# Patient Record
Sex: Male | Born: 1970
Health system: Southern US, Community
[De-identification: ages and names within clinical notes are randomized; demographics above are authoritative.]

## PROBLEM LIST (undated history)

## (undated) DIAGNOSIS — G8929 Other chronic pain: Secondary | ICD-10-CM

## (undated) DIAGNOSIS — Z8739 Personal history of other diseases of the musculoskeletal system and connective tissue: Secondary | ICD-10-CM

## (undated) DIAGNOSIS — E119 Type 2 diabetes mellitus without complications: Secondary | ICD-10-CM

## (undated) DIAGNOSIS — M549 Dorsalgia, unspecified: Secondary | ICD-10-CM

## (undated) DIAGNOSIS — I10 Essential (primary) hypertension: Secondary | ICD-10-CM

## (undated) DIAGNOSIS — M4800 Spinal stenosis, site unspecified: Secondary | ICD-10-CM

## (undated) DIAGNOSIS — M5126 Other intervertebral disc displacement, lumbar region: Secondary | ICD-10-CM

## (undated) HISTORY — PX: ANKLE SURGERY: SHX546

---

## 2010-03-02 ENCOUNTER — Emergency Department (HOSPITAL_COMMUNITY): Admission: EM | Admit: 2010-03-02 | Discharge: 2010-03-02 | Payer: Self-pay | Admitting: Emergency Medicine

## 2013-05-29 ENCOUNTER — Emergency Department (HOSPITAL_COMMUNITY)
Admission: EM | Admit: 2013-05-29 | Discharge: 2013-05-30 | Disposition: A | Discharge status: Home / Self Care | Payer: PRIVATE HEALTH INSURANCE | Attending: Emergency Medicine | Admitting: Emergency Medicine

## 2013-05-29 ENCOUNTER — Encounter (HOSPITAL_COMMUNITY): Payer: Self-pay | Admitting: Adult Health

## 2013-05-29 DIAGNOSIS — L03039 Cellulitis of unspecified toe: Secondary | ICD-10-CM | POA: Insufficient documentation

## 2013-05-29 DIAGNOSIS — L03032 Cellulitis of left toe: Secondary | ICD-10-CM

## 2013-05-29 NOTE — ED Notes (Signed)
Presents with ingrown toenail to left great toe that began 3-4 days ago and has been draining purulent drainage.

## 2013-05-30 NOTE — ED Provider Notes (Signed)
CSN: [id number]     Arrival date & time 05/29/13  2059 History   First MD Initiated Contact with Patient 05/29/13 2347     Chief Complaint  Patient presents with  . Toe Pain   (Consider location/radiation/quality/duration/timing/severity/associated sxs/prior Treatment) HPI Comments: Is a normal, healthy, active 43 year old gentleman, who presents with left toe pain.  It appears that he has a paronychia.  He has tried using Green alcohol and soaking without resolution  Patient is a 42 y.o. male presenting with toe pain. The history is provided by the patient.  Toe Pain This is a new problem. The current episode started in the past 7 days. The problem occurs constantly. The problem has been gradually worsening. Pertinent negatives include no joint swelling.    History reviewed. No pertinent past medical history. History reviewed. No pertinent past surgical history. History reviewed. No pertinent family history. History  Substance Use Topics  . Smoking status: Never Smoker   . Smokeless tobacco: Not on file  . Alcohol Use: Yes    Review of Systems  Musculoskeletal: Negative for joint swelling.  Skin: Positive for wound.  All other systems reviewed and are negative.    Allergies  Review of patient's allergies indicates no known allergies.  Home Medications  No current outpatient prescriptions on file. BP 150/85  Pulse 86  Temp(Src) 98.5 F (36.9 C) (Oral)  Resp 20  SpO2 97% Physical Exam  Nursing note and vitals reviewed. Constitutional: He appears well-developed and well-nourished.  Eyes: Pupils are equal, round, and reactive to light.  Neck: Normal range of motion.  Cardiovascular: Normal rate and regular rhythm.   Musculoskeletal: He exhibits tenderness.       Feet:  Or Nicki a    ED Course  INCISION AND DRAINAGE Date/Time: 05/30/2013 12:18 AM Performed by: Arman Filter Authorized by: Arman Filter Consent: Verbal consent obtained. Risks and benefits:  risks, benefits and alternatives were discussed Consent given by: patient Patient identity confirmed: verbally with patient Indications for incision and drainage: Paronychia left great, toe. Local anesthetic: lidocaine 1% with epinephrine Anesthetic total: 0.5 ml Patient sedated: no Needle gauge: 18 Incision type: single straight Complexity: simple Drainage: purulent Drainage amount: moderate Wound treatment: wound left open   (including critical care time) Labs Review Labs Reviewed - No data to display Imaging Review No results found.  MDM   1. Paronychia of great toe of left foot     Paronychia drained    Arman Filter, NP 05/30/13 0020

## 2013-05-30 NOTE — ED Provider Notes (Signed)
Medical screening examination/treatment/procedure(s) were performed by non-physician practitioner and as supervising physician I was immediately available for consultation/collaboration.  Genita Nilsson, MD 05/30/13 0028 

## 2013-05-30 NOTE — ED Notes (Signed)
Pt soaking left foot in warm water with betadine.

## 2014-07-11 ENCOUNTER — Emergency Department (HOSPITAL_BASED_OUTPATIENT_CLINIC_OR_DEPARTMENT_OTHER)
Admission: EM | Admit: 2014-07-11 | Discharge: 2014-07-12 | Disposition: A | Discharge status: Home / Self Care | Payer: Worker's Compensation | Attending: Emergency Medicine | Admitting: Emergency Medicine

## 2014-07-11 ENCOUNTER — Encounter (HOSPITAL_BASED_OUTPATIENT_CLINIC_OR_DEPARTMENT_OTHER): Payer: Self-pay | Admitting: Emergency Medicine

## 2014-07-11 DIAGNOSIS — Y990 Civilian activity done for income or pay: Secondary | ICD-10-CM | POA: Diagnosis not present

## 2014-07-11 DIAGNOSIS — Y9289 Other specified places as the place of occurrence of the external cause: Secondary | ICD-10-CM | POA: Diagnosis not present

## 2014-07-11 DIAGNOSIS — S3992XA Unspecified injury of lower back, initial encounter: Secondary | ICD-10-CM | POA: Diagnosis present

## 2014-07-11 DIAGNOSIS — M545 Low back pain, unspecified: Secondary | ICD-10-CM

## 2014-07-11 DIAGNOSIS — S6990XA Unspecified injury of unspecified wrist, hand and finger(s), initial encounter: Secondary | ICD-10-CM | POA: Insufficient documentation

## 2014-07-11 DIAGNOSIS — W010XXA Fall on same level from slipping, tripping and stumbling without subsequent striking against object, initial encounter: Secondary | ICD-10-CM | POA: Insufficient documentation

## 2014-07-11 DIAGNOSIS — M25539 Pain in unspecified wrist: Secondary | ICD-10-CM

## 2014-07-11 DIAGNOSIS — Y9389 Activity, other specified: Secondary | ICD-10-CM | POA: Insufficient documentation

## 2014-07-11 NOTE — ED Notes (Signed)
Patient reports that he slipped on oil at work and fell backwards. Complains of lower back pain and bilateral wrist pain, no loc

## 2014-07-12 ENCOUNTER — Emergency Department (HOSPITAL_BASED_OUTPATIENT_CLINIC_OR_DEPARTMENT_OTHER): Payer: Worker's Compensation

## 2014-07-12 MED ORDER — NAPROXEN 500 MG PO TABS: 500.0000 mg | ORAL_TABLET | Freq: Two times a day (BID) | ORAL | Status: DC

## 2014-07-12 MED ORDER — NAPROXEN 250 MG PO TABS
500.0000 mg | ORAL_TABLET | Freq: Once | ORAL | Status: AC
  Administered 2014-07-12: 500 mg via ORAL
  Filled 2014-07-12: qty 2

## 2014-07-12 NOTE — ED Provider Notes (Signed)
CSN: [id number]     Arrival date & time 07/11/14  2314 History   First MD Initiated Contact with Patient 07/11/14 2341     Chief Complaint  Patient presents with  . Fall     (Consider location/radiation/quality/duration/timing/severity/associated sxs/prior Treatment) HPI 43 yo male presents to the ER from work after a fall.  Pt reports he slipped on oil, landing on his hands and right back/hip area.  Pt c/o bilateral wrist soreness and right back pain.  No weakness, numbness, bowel or bladder dysfunction.  Pt denies any hand weakness. History reviewed. No pertinent past medical history. History reviewed. No pertinent past surgical history. No family history on file. History  Substance Use Topics  . Smoking status: Never Smoker   . Smokeless tobacco: Not on file  . Alcohol Use: Yes    Review of Systems  All other systems reviewed and are negative.     Allergies  Review of patient's allergies indicates no known allergies.  Home Medications   Prior to Admission medications   Not on File   BP 138/79  Pulse 95  Temp(Src) 97.6 F (36.4 C) (Oral)  Resp 18  Wt 215 lb (97.523 kg)  SpO2 97% Physical Exam  Nursing note and vitals reviewed. Constitutional: He is oriented to person, place, and time. He appears well-developed and well-nourished. He appears distressed (uncomfortable appearing).  HENT:  Head: Normocephalic and atraumatic.  Nose: Nose normal.  Mouth/Throat: Oropharynx is clear and moist.  Eyes: Conjunctivae and EOM are normal. Pupils are equal, round, and reactive to light.  Neck: Normal range of motion. Neck supple. No JVD present. No tracheal deviation present. No thyromegaly present.  Cardiovascular: Normal rate, regular rhythm, normal heart sounds and intact distal pulses.  Exam reveals no gallop and no friction rub.   No murmur heard. Pulmonary/Chest: Effort normal and breath sounds normal. No stridor. No respiratory distress. He has no wheezes. He has no  rales. He exhibits no tenderness.  Abdominal: Soft. Bowel sounds are normal. He exhibits no distension and no mass. There is no tenderness. There is no rebound and no guarding.  Musculoskeletal: Normal range of motion. He exhibits tenderness (ttp over lower lumbar without stepoff or crepitus, ttp over right SI joint, right paraspinal muscles). He exhibits no edema.  No swelling of wrists.  No pain with ROM, no snuffbox tenderness with palpation.    Lymphadenopathy:    He has no cervical adenopathy.  Neurological: He is alert and oriented to person, place, and time. He displays normal reflexes. He exhibits normal muscle tone. Coordination normal.  Skin: Skin is warm and dry. No rash noted. No erythema. No pallor.  Psychiatric: He has a normal mood and affect. His behavior is normal. Judgment and thought content normal.    ED Course  Procedures (including critical care time) Labs Review Labs Reviewed - No data to display  Imaging Review Dg Lumbar Spine Complete  07/12/2014   CLINICAL DATA:  Slipped on oil while at work and fell backwards, with acute onset of lower back pain. Initial encounter.  EXAM: LUMBAR SPINE - COMPLETE 4+ VIEW  COMPARISON:  None.  FINDINGS: There is no evidence of fracture or subluxation. Vertebral bodies demonstrate normal height and alignment. Intervertebral disc spaces are preserved. The visualized neural foramina are grossly unremarkable in appearance.  The visualized bowel gas pattern is unremarkable in appearance; air and stool are noted within the colon. The sacroiliac joints are within normal limits.  IMPRESSION: No evidence of fracture  or subluxation along the lumbar spine.   Electronically Signed   By: Roanna RaiderJeffery  Chang M.D.   On: 07/12/2014 01:20     EKG Interpretation None      MDM   Final diagnoses:  Fall due to wet surface, initial encounter  Right-sided low back pain without sciatica  Wrist pain, acute, unspecified laterality    43 yo male s/p fall  at work.  Will get lumbar films, treat with nsaid.    Olivia Mackielga M Annalucia Laino, MD 07/12/14 [phone]

## 2014-07-12 NOTE — Discharge Instructions (Signed)
Take medications as prescribed.  Expect to have some soreness over the next 7-10 days.  Follow up with your doctor for recheck in 5-7 days.   Back Exercises Back exercises help treat and prevent back injuries. The goal is to increase your strength in your belly (abdominal) and back muscles. These exercises can also help with flexibility. Start these exercises when told by your doctor. HOME CARE Back exercises include: Pelvic Tilt.  Lie on your back with your knees bent. Tilt your pelvis until the lower part of your back is against the floor. Hold this position 5 to 10 sec. Repeat this exercise 5 to 10 times. Knee to Chest.  Pull 1 knee up against your chest and hold for 20 to 30 seconds. Repeat this with the other knee. This may be done with the other leg straight or bent, whichever feels better. Then, pull both knees up against your chest. Sit-Ups or Curl-Ups.  Bend your knees 90 degrees. Start with tilting your pelvis, and do a partial, slow sit-up. Only lift your upper half 30 to 45 degrees off the floor. Take at least 2 to 3 seonds for each sit-up. Do not do sit-ups with your knees out straight. If partial sit-ups are difficult, simply do the above but with only tightening your belly (abdominal) muscles and holding it as told. Hip-Lift.  Lie on your back with your knees flexed 90 degrees. Push down with your feet and shoulders as you raise your hips 2 inches off the floor. Hold for 10 seconds, repeat 5 to 10 times. Back Arches.  Lie on your stomach. Prop yourself up on bent elbows. Slowly press on your hands, causing an arch in your low back. Repeat 3 to 5 times. Shoulder-Lifts.  Lie face down with arms beside your body. Keep hips and belly pressed to floor as you slowly lift your head and shoulders off the floor. Do not overdo your exercises. Be careful in the beginning. Exercises may cause you some mild back discomfort. If the pain lasts for more than 15 minutes, stop the exercises  until you see your doctor. Improvement with exercise for back problems is slow.  Document Released: 10/21/2010 Document Revised: 12/11/2011 Document Reviewed: 07/20/2011 [facility] Patient Information 2015 [place], [place]. This information is not intended to replace advice given to you by your health care provider. Make sure you discuss any questions you have with your health care provider.  Back Pain, Adult Low back pain is very common. About 1 in 5 people have back pain.The cause of low back pain is rarely dangerous. The pain often gets better over time.About half of people with a sudden onset of back pain feel better in just 2 weeks. About 8 in 10 people feel better by 6 weeks.  CAUSES Some common causes of back pain include:  Strain of the muscles or ligaments supporting the spine.  Wear and tear (degeneration) of the spinal discs.  Arthritis.  Direct injury to the back. DIAGNOSIS Most of the time, the direct cause of low back pain is not known.However, back pain can be treated effectively even when the exact cause of the pain is unknown.Answering your caregiver's questions about your overall health and symptoms is one of the most accurate ways to make sure the cause of your pain is not dangerous. If your caregiver needs more information, he or she may order lab work or imaging tests (X-rays or MRIs).However, even if imaging tests show changes in your back, this usually does not  require surgery. HOME CARE INSTRUCTIONS For many people, back pain returns.Since low back pain is rarely dangerous, it is often a condition that people can learn to [facility] Cent[facility]ageon their own.   Remain active. It is stressful on the back to sit or stand in one place. Do not sit, drive, or stand in one place for more than 30 minutes at a time. Take short walks on level surfaces as soon as pain allows.Try to increase the length of time you walk each day.  Do not stay in bed.Resting more than 1 or 2 days can delay your  recovery.  Do not avoid exercise or work.Your body is made to move.It is not dangerous to be active, even though your back may hurt.Your back will likely heal faster if you return to being active before your pain is gone.  Pay attention to your body when you bend and lift. Many people have less discomfortwhen lifting if they bend their knees, keep the load close to their bodies,and avoid twisting. Often, the most comfortable positions are those that put less stress on your recovering back.  Find a comfortable position to sleep. Use a firm mattress and lie on your side with your knees slightly bent. If you lie on your back, put a pillow under your knees.  Only take over-the-counter or prescription medicines as directed by your caregiver. Over-the-counter medicines to reduce pain and inflammation are often the most helpful.Your caregiver may prescribe muscle relaxant drugs.These medicines help dull your pain so you can more quickly return to your normal activities and healthy exercise.  Put ice on the injured area.  Put ice in a plastic bag.  Place a towel between your skin and the bag.  Leave the ice on for 15-20 minutes, 03-04 times a day for the first 2 to 3 days. After that, ice and heat may be alternated to reduce pain and spasms.  Ask your caregiver about trying back exercises and gentle massage. This may be of some benefit.  Avoid feeling anxious or stressed.Stress increases muscle tension and can worsen back pain.It is important to recognize when you are anxious or stressed and learn ways to manage it.Exercise is a great option. SEEK MEDICAL CARE IF:  You have pain that is not relieved with rest or medicine.  You have pain that does not improve in 1 week.  You have new symptoms.  You are generally not feeling well. SEEK IMMEDIATE MEDICAL CARE IF:   You have pain that radiates from your back into your legs.  You develop new bowel or bladder control problems.  You  have unusual weakness or numbness in your arms or legs.  You develop nausea or vomiting.  You develop abdominal pain.  You feel faint. Document Released: 09/18/2005 Document Revised: 03/19/2012 Document Reviewed: 01/20/2014 [facility] Patient Information 2015 [place], [place]. This information is not intended to replace advice given to you by your health care provider. Make sure you discuss any questions you have with your health care provider.  Wrist Pain Wrist injuries are frequent in adults and children. A sprain is an injury to the ligaments that hold your bones together. A strain is an injury to muscle or muscle cord-like structures (tendons) from stretching or pulling. Generally, when wrists are moderately tender to touch following a fall or injury, a break in the bone (fracture) may be present. Most wrist sprains or strains are better in 3 to 5 days, but complete healing may take several weeks. HOME CARE INSTRUCTIONS  Put ice on the injured area.  Put ice in a plastic bag.  Place a towel between your skin and the bag.  Leave the ice on for 15-20 minutes, 3-4 times a day, for the first 2 days, or as directed by your health care provider.  Keep your arm raised above the level of your heart whenever possible to reduce swelling and pain.  Rest the injured area for at least 48 hours or as directed by your health care provider.  If a splint or elastic bandage has been applied, use it for as long as directed by your health care provider or until seen by a health care provider for a follow-up exam.  Only take over-the-counter or prescription medicines for pain, discomfort, or fever as directed by your health care provider.  Keep all follow-up appointments. You may need to follow up with a specialist or have follow-up X-rays. Improvement in pain level is not a guarantee that you did not fracture a bone in your wrist. The only way to determine whether or not you have a broken bone is by  X-ray. SEEK IMMEDIATE MEDICAL CARE IF:   Your fingers are swollen, very red, white, or cold and blue.  Your fingers are numb or tingling.  You have increasing pain.  You have difficulty moving your fingers. MAKE SURE YOU:   Understand these instructions.  Will watch your condition.  Will get help right away if you are not doing well or get worse. Document Released: 06/28/2005 Document Revised: 09/23/2013 Document Reviewed: 11/09/2010 [facility] Patient Information 2015 [place], [place]. This information is not intended to replace advice given to you by your health care provider. Make sure you discuss any questions you have with your health care provider.

## 2014-08-12 DIAGNOSIS — M5127 Other intervertebral disc displacement, lumbosacral region: Secondary | ICD-10-CM | POA: Insufficient documentation

## 2014-12-07 ENCOUNTER — Emergency Department (HOSPITAL_BASED_OUTPATIENT_CLINIC_OR_DEPARTMENT_OTHER): Payer: PRIVATE HEALTH INSURANCE

## 2014-12-07 ENCOUNTER — Encounter (HOSPITAL_BASED_OUTPATIENT_CLINIC_OR_DEPARTMENT_OTHER): Payer: Self-pay | Admitting: Emergency Medicine

## 2014-12-07 ENCOUNTER — Emergency Department (HOSPITAL_BASED_OUTPATIENT_CLINIC_OR_DEPARTMENT_OTHER)
Admission: EM | Admit: 2014-12-07 | Discharge: 2014-12-07 | Disposition: A | Discharge status: Home / Self Care | Payer: PRIVATE HEALTH INSURANCE | Attending: Emergency Medicine | Admitting: Emergency Medicine

## 2014-12-07 DIAGNOSIS — J069 Acute upper respiratory infection, unspecified: Secondary | ICD-10-CM

## 2014-12-07 DIAGNOSIS — Z791 Long term (current) use of non-steroidal anti-inflammatories (NSAID): Secondary | ICD-10-CM | POA: Insufficient documentation

## 2014-12-07 DIAGNOSIS — G8929 Other chronic pain: Secondary | ICD-10-CM | POA: Insufficient documentation

## 2014-12-07 HISTORY — DX: Other chronic pain: G89.29

## 2014-12-07 HISTORY — DX: Dorsalgia, unspecified: M54.9

## 2014-12-07 MED ORDER — NAPROXEN 375 MG PO TABS: 375.0000 mg | ORAL_TABLET | Freq: Two times a day (BID) | ORAL | Status: DC

## 2014-12-07 MED ORDER — BENZONATATE 100 MG PO CAPS: 100.0000 mg | ORAL_CAPSULE | Freq: Three times a day (TID) | ORAL | Status: DC

## 2014-12-07 MED ORDER — IBUPROFEN 800 MG PO TABS
800.0000 mg | ORAL_TABLET | Freq: Once | ORAL | Status: AC
  Administered 2014-12-07: 800 mg via ORAL
  Filled 2014-12-07: qty 1

## 2014-12-07 MED ORDER — DM-GUAIFENESIN ER 30-600 MG PO TB12: 1.0000 | ORAL_TABLET | Freq: Two times a day (BID) | ORAL | Status: DC

## 2014-12-07 MED ORDER — GUAIFENESIN 100 MG/5ML PO SOLN
5.0000 mL | Freq: Once | ORAL | Status: AC
  Administered 2014-12-07: 100 mg via ORAL
  Filled 2014-12-07: qty 5

## 2014-12-07 NOTE — Discharge Instructions (Signed)
Cool Mist Vaporizers °Vaporizers may help relieve the symptoms of a cough and cold. They add moisture to the air, which helps mucus to become thinner and less sticky. This makes it easier to breathe and cough up secretions. Cool mist vaporizers do not cause serious burns like hot mist vaporizers, which may also be called steamers or humidifiers. Vaporizers have not been proven to help with colds. You should not use a vaporizer if you are allergic to mold. °HOME CARE INSTRUCTIONS °· Follow the package instructions for the vaporizer. °· Do not use anything other than distilled water in the vaporizer. °· Do not run the vaporizer all of the time. This can cause mold or bacteria to grow in the vaporizer. °· Clean the vaporizer after each time it is used. °· Clean and dry the vaporizer well before storing it. °· Stop using the vaporizer if worsening respiratory symptoms develop. °Document Released: 06/15/2004 Document Revised: 09/23/2013 Document Reviewed: 02/05/2013 °ExitCare® Patient Information ©2015 [employer]. This information is not intended to replace advice given to you by your health care provider. Make sure you discuss any questions you have with your health care provider. ° °

## 2014-12-07 NOTE — ED Provider Notes (Signed)
CSN: [id number]     Arrival date & time 12/07/14  0549 History   First MD Initiated Contact with Patient 12/07/14 [phone]     Chief Complaint  Patient presents with  . Cough     (Consider location/radiation/quality/duration/timing/severity/associated sxs/prior Treatment) Patient is a 44 y.o. male presenting with cough. The history is provided by the patient. No language interpreter was used.  Cough Cough characteristics:  Non-productive Severity:  Moderate Onset quality:  Gradual Duration:  4 days Timing:  Intermittent Progression:  Unchanged Chronicity:  New Smoker: no   Context: sick contacts   Relieved by:  Nothing Worsened by:  Nothing tried Ineffective treatments: alka seltzer  Associated symptoms: no chest pain, no diaphoresis, no myalgias and no wheezing   Risk factors: no recent infection     Past Medical History  Diagnosis Date  . Chronic back pain    Past Surgical History  Procedure Laterality Date  . Ankle surgery     History reviewed. No pertinent family history. History  Substance Use Topics  . Smoking status: Never Smoker   . Smokeless tobacco: Never Used  . Alcohol Use: Yes    Review of Systems  Constitutional: Negative for diaphoresis.  Respiratory: Positive for cough. Negative for chest tightness and wheezing.   Cardiovascular: Negative for chest pain.  Musculoskeletal: Negative for myalgias.  All other systems reviewed and are negative.     Allergies  Review of patient's allergies indicates no known allergies.  Home Medications   Prior to Admission medications   Medication Sig Start Date End Date Taking? Authorizing Provider  naproxen (NAPROSYN) 500 MG tablet Take 1 tablet (500 mg total) by mouth 2 (two) times daily with a meal. 07/12/14   Olivia Mackielga M Otter, MD   BP 133/80 mmHg  Pulse 90  Temp(Src) 99.3 F (37.4 C) (Oral)  Resp 18  SpO2 96% Physical Exam  Constitutional: He is oriented to person, place, and time. He appears well-developed  and well-nourished. No distress.  HENT:  Head: Normocephalic and atraumatic.  Mouth/Throat: Oropharynx is clear and moist.  Eyes: Conjunctivae are normal. Pupils are equal, round, and reactive to light.  Neck: Normal range of motion. Neck supple.  Cardiovascular: Normal rate, regular rhythm and intact distal pulses.   Pulmonary/Chest: Effort normal and breath sounds normal. No respiratory distress. He has no wheezes. He has no rales.  Abdominal: Soft. Bowel sounds are normal. There is no tenderness. There is no rebound and no guarding.  Musculoskeletal: Normal range of motion.  Neurological: He is alert and oriented to person, place, and time.  Skin: Skin is warm and dry.  Psychiatric: He has a normal mood and affect.    ED Course  Procedures (including critical care time) Labs Review Labs Reviewed - No data to display  Imaging Review No results found.   EKG Interpretation None      MDM   Final diagnoses:  None    Symptoms consistent with viral infection will treat with cough suppressants and pain medication.  Close follow up with your PMD    Cornelio Parkerson K Verner Mccrone-Rasch, MD 12/07/14 [phone]

## 2014-12-07 NOTE — ED Notes (Signed)
Pt reports cough x2 weeks OTC mediations ineffective thought it was getting better and now symptoms have worsened.

## 2014-12-22 ENCOUNTER — Encounter (HOSPITAL_BASED_OUTPATIENT_CLINIC_OR_DEPARTMENT_OTHER): Payer: Self-pay | Admitting: *Deleted

## 2014-12-22 ENCOUNTER — Emergency Department (HOSPITAL_BASED_OUTPATIENT_CLINIC_OR_DEPARTMENT_OTHER): Payer: 59

## 2014-12-22 ENCOUNTER — Emergency Department (HOSPITAL_BASED_OUTPATIENT_CLINIC_OR_DEPARTMENT_OTHER)
Admission: EM | Admit: 2014-12-22 | Discharge: 2014-12-22 | Disposition: A | Discharge status: Home / Self Care | Payer: 59 | Attending: Emergency Medicine | Admitting: Emergency Medicine

## 2014-12-22 DIAGNOSIS — G8929 Other chronic pain: Secondary | ICD-10-CM | POA: Diagnosis not present

## 2014-12-22 DIAGNOSIS — R05 Cough: Secondary | ICD-10-CM | POA: Diagnosis present

## 2014-12-22 DIAGNOSIS — Z79899 Other long term (current) drug therapy: Secondary | ICD-10-CM | POA: Insufficient documentation

## 2014-12-22 DIAGNOSIS — R000 Tachycardia, unspecified: Secondary | ICD-10-CM | POA: Diagnosis not present

## 2014-12-22 DIAGNOSIS — R059 Cough, unspecified: Secondary | ICD-10-CM

## 2014-12-22 DIAGNOSIS — R739 Hyperglycemia, unspecified: Secondary | ICD-10-CM | POA: Insufficient documentation

## 2014-12-22 DIAGNOSIS — F419 Anxiety disorder, unspecified: Secondary | ICD-10-CM | POA: Diagnosis not present

## 2014-12-22 DIAGNOSIS — Z791 Long term (current) use of non-steroidal anti-inflammatories (NSAID): Secondary | ICD-10-CM | POA: Diagnosis not present

## 2014-12-22 LAB — BASIC METABOLIC PANEL
Anion gap: 11 (ref 5–15)
BUN: 18 mg/dL (ref 6–23)
CO2: 24 mmol/L (ref 19–32)
CREATININE: 0.91 mg/dL (ref 0.50–1.35)
Calcium: 9 mg/dL (ref 8.4–10.5)
Chloride: 98 mmol/L (ref 96–112)
GFR calc Af Amer: >90 mL/min (ref >90)
GLUCOSE: 414 mg/dL — AB (ref 70–99)
POTASSIUM: 3.9 mmol/L (ref 3.5–5.1)
Sodium: 133 mmol/L — ABNORMAL LOW (ref 135–145)

## 2014-12-22 LAB — URINE MICROSCOPIC-ADD ON

## 2014-12-22 LAB — URINALYSIS, ROUTINE W REFLEX MICROSCOPIC
BILIRUBIN URINE: NEGATIVE
Glucose, UA: >1000 mg/dL — AB
Hgb urine dipstick: NEGATIVE
Ketones, ur: NEGATIVE mg/dL
LEUKOCYTES UA: NEGATIVE
NITRITE: NEGATIVE
PH: 5.5 (ref 5.0–8.0)
Protein, ur: NEGATIVE mg/dL
Specific Gravity, Urine: 1.021 (ref 1.005–1.030)
UROBILINOGEN UA: 0.2 mg/dL (ref 0.0–1.0)

## 2014-12-22 LAB — D-DIMER, QUANTITATIVE (NOT AT ARMC): D DIMER QUANT: 0.29 ug{FEU}/mL (ref 0.00–0.48)

## 2014-12-22 MED ORDER — METFORMIN HCL 500 MG PO TABS: 500.0000 mg | ORAL_TABLET | Freq: Two times a day (BID) | ORAL | Status: DC

## 2014-12-22 MED ORDER — ALBUTEROL SULFATE HFA 108 (90 BASE) MCG/ACT IN AERS: 1.0000 | INHALATION_SPRAY | Freq: Four times a day (QID) | RESPIRATORY_TRACT | Status: DC | PRN

## 2014-12-22 NOTE — ED Notes (Signed)
Dr Merlene Pullingancour okayed pt to give pt water. Pt given ice water to drink.

## 2014-12-22 NOTE — Discharge Instructions (Signed)
Hyperglycemia As discussed there is concern that you are diabetic. Take the metformin as prescribed. Follow-up with your doctor this week. Return to the ED with new or worsening symptoms. Hyperglycemia occurs when the glucose (sugar) in your blood is too high. Hyperglycemia can happen for many reasons, but it most often happens to people who do not know they have diabetes or are not managing their diabetes properly.  CAUSES  Whether you have diabetes or not, there are other causes of hyperglycemia. Hyperglycemia can occur when you have diabetes, but it can also occur in other situations that you might not be as aware of, such as: Diabetes  If you have diabetes and are having problems controlling your blood glucose, hyperglycemia could occur because of some of the following reasons:  Not following your meal plan.  Not taking your diabetes medications or not taking it properly.  Exercising less or doing less activity than you normally do.  Being sick. Pre-diabetes  This cannot be ignored. Before people develop Type 2 diabetes, they almost always have "pre-diabetes." This is when your blood glucose levels are higher than normal, but not yet high enough to be diagnosed as diabetes. Research has shown that some long-term damage to the body, especially the heart and circulatory system, may already be occurring during pre-diabetes. If you take action to manage your blood glucose when you have pre-diabetes, you may delay or prevent Type 2 diabetes from developing. Stress  If you have diabetes, you may be "diet" controlled or on oral medications or insulin to control your diabetes. However, you may find that your blood glucose is higher than usual in the hospital whether you have diabetes or not. This is often referred to as "stress hyperglycemia." Stress can elevate your blood glucose. This happens because of hormones put out by the body during times of stress. If stress has been the cause of your high  blood glucose, it can be followed regularly by your caregiver. That way he/she can make sure your hyperglycemia does not continue to get worse or progress to diabetes. Steroids  Steroids are medications that act on the infection fighting system (immune system) to block inflammation or infection. One side effect can be a rise in blood glucose. Most people can produce enough extra insulin to allow for this rise, but for those who cannot, steroids make blood glucose levels go even higher. It is not unusual for steroid treatments to "uncover" diabetes that is developing. It is not always possible to determine if the hyperglycemia will go away after the steroids are stopped. A special blood test called an A1c is sometimes done to determine if your blood glucose was elevated before the steroids were started. SYMPTOMS  Thirsty.  Frequent urination.  Dry mouth.  Blurred vision.  Tired or fatigue.  Weakness.  Sleepy.  Tingling in feet or leg. DIAGNOSIS  Diagnosis is made by monitoring blood glucose in one or all of the following ways:  A1c test. This is a chemical found in your blood.  Fingerstick blood glucose monitoring.  Laboratory results. TREATMENT  First, knowing the cause of the hyperglycemia is important before the hyperglycemia can be treated. Treatment may include, but is not be limited to:  Education.  Change or adjustment in medications.  Change or adjustment in meal plan.  Treatment for an illness, infection, etc.  More frequent blood glucose monitoring.  Change in exercise plan.  Decreasing or stopping steroids.  Lifestyle changes. HOME CARE INSTRUCTIONS   Test your blood  glucose as directed.  Exercise regularly. Your caregiver will give you instructions about exercise. Pre-diabetes or diabetes which comes on with stress is helped by exercising.  Eat wholesome, balanced meals. Eat often and at regular, fixed times. Your caregiver or nutritionist will give you  a meal plan to guide your sugar intake.  Being at an ideal weight is important. If needed, losing as little as 10 to 15 pounds may help improve blood glucose levels. SEEK MEDICAL CARE IF:   You have questions about medicine, activity, or diet.  You continue to have symptoms (problems such as increased thirst, urination, or weight gain). SEEK IMMEDIATE MEDICAL CARE IF:   You are vomiting or have diarrhea.  Your breath smells fruity.  You are breathing faster or slower.  You are very sleepy or incoherent.  You have numbness, tingling, or pain in your feet or hands.  You have chest pain.  Your symptoms get worse even though you have been following your caregiver's orders.  If you have any other questions or concerns. Document Released: 03/14/2001 Document Revised: 12/11/2011 Document Reviewed: 01/15/2012 [facility] Patient Information 2015 [place], [place]. This information is not intended to replace advice given to you by your health care provider. Make sure you discuss any questions you have with your health care provider.

## 2014-12-22 NOTE — ED Notes (Signed)
Pt given 2 cups of water 

## 2014-12-22 NOTE — ED Notes (Addendum)
C/o Coughing up yellow secretions and last time he coughed he felt secretions got stuck in his throat and still feels like it is still there. Onset 2 weeks ago. Pt states he took hydrocodiene prior to coming in and he feels jittery.

## 2014-12-22 NOTE — ED Notes (Signed)
Pt request x 3 for water. Instructed pt that the doctor needs to see him before he can have anything to eat or drink.

## 2014-12-22 NOTE — ED Notes (Signed)
Pt girlfriend came to desk asking for pt something to drink. Instructed when MD comes in room to ask him, and I would bring it, but that pt needs to be seen first.

## 2014-12-22 NOTE — ED Provider Notes (Signed)
CSN: [id number]     Arrival date & time 12/22/14  1010 History  This chart was scribed for Glynn OctaveStephen Laycee Fitzsimmons, MD by Leone PayorSonum Patel, ED Scribe. This patient was seen in room MH08/MH08 and the patient's care was started 10:54 AM.    Chief Complaint  Patient presents with  . Cough   The history is provided by the patient. No language interpreter was used.     HPI Comments: Trevor Medina is a 44 y.o. male who presents to the Emergency Department complaining of a continued, intermittent cough that has been ongoing for the past 2 weeks. Patient was seen on 12/07/14 for the same symptoms and was prescribed Mucinex DM and Tessalon. He reports taking this initially with relief but then stopped 4-5 days ago. He states the cough has worsened since he stopped taking the medication. He denies SOB, CP, fever, vomiting.   Past Medical History  Diagnosis Date  . Chronic back pain    Past Surgical History  Procedure Laterality Date  . Ankle surgery     No family history on file. History  Substance Use Topics  . Smoking status: Never Smoker   . Smokeless tobacco: Never Used  . Alcohol Use: Yes    Review of Systems  A complete 10 system review of systems was obtained and all systems are negative except as noted in the HPI and PMH.    Allergies  Review of patient's allergies indicates no known allergies.  Home Medications   Prior to Admission medications   Medication Sig Start Date End Date Taking? Authorizing Provider  dextromethorphan-guaiFENesin (MUCINEX DM) 30-600 MG per 12 hr tablet Take 1 tablet by mouth 2 (two) times daily. 12/07/14  Yes April Palumbo, MD  HYDROcodone-acetaminophen (NORCO/VICODIN) 5-325 MG per tablet Take 1 tablet by mouth every 6 (six) hours as needed for moderate pain.   Yes Historical Provider, MD  albuterol (PROVENTIL HFA;VENTOLIN HFA) 108 (90 BASE) MCG/ACT inhaler Inhale 1-2 puffs into the lungs every 6 (six) hours as needed for wheezing or shortness of breath. 12/22/14    Glynn OctaveStephen Kyomi Hector, MD  benzonatate (TESSALON) 100 MG capsule Take 1 capsule (100 mg total) by mouth every 8 (eight) hours. 12/07/14   April Palumbo, MD  metFORMIN (GLUCOPHAGE) 500 MG tablet Take 1 tablet (500 mg total) by mouth 2 (two) times daily with a meal. 12/22/14   Glynn OctaveStephen Jahniya Duzan, MD  naproxen (NAPROSYN) 375 MG tablet Take 1 tablet (375 mg total) by mouth 2 (two) times daily. 12/07/14   April Palumbo, MD  naproxen (NAPROSYN) 500 MG tablet Take 1 tablet (500 mg total) by mouth 2 (two) times daily with a meal. 07/12/14   Marisa Severinlga Otter, MD   BP 152/95 mmHg  Pulse 98  Temp(Src) 97.8 F (36.6 C) (Oral)  Resp 20  Ht 5\' 10"  (1.778 m)  Wt 216 lb (97.977 kg)  BMI 30.99 kg/m2  SpO2 98% Physical Exam  Constitutional: He is oriented to person, place, and time. He appears well-developed and well-nourished. No distress.  Anxious appearing  HENT:  Head: Normocephalic and atraumatic.  Mouth/Throat: Oropharynx is clear and moist. No oropharyngeal exudate.  Oropharynx is clear and normal. Bilateral TM's and ear canals are normal.   Eyes: Conjunctivae and EOM are normal. Pupils are equal, round, and reactive to light.  Neck: Normal range of motion. Neck supple.  No meningismus.  Cardiovascular: Regular rhythm, normal heart sounds and intact distal pulses.  Tachycardia present.   No murmur heard. Pulmonary/Chest: Effort normal and breath  sounds normal. No respiratory distress.  Decreased breath sounds   Abdominal: Soft. There is no tenderness. There is no rebound and no guarding.  Musculoskeletal: Normal range of motion. He exhibits no edema or tenderness.  Neurological: He is alert and oriented to person, place, and time. No cranial nerve deficit. He exhibits normal muscle tone. Coordination normal.  No ataxia on finger to nose bilaterally. No pronator drift. 5/5 strength throughout. CN 2-12 intact. Negative Romberg. Equal grip strength. Sensation intact. Gait is normal.   Skin: Skin is warm.   Psychiatric: He has a normal mood and affect. His behavior is normal.  Nursing note and vitals reviewed.   ED Course  Procedures (including critical care time)  DIAGNOSTIC STUDIES: Oxygen Saturation is 97% on RA, adequate by my interpretation.    COORDINATION OF CARE: 11:02 AM Will order CXR. Discussed treatment plan with pt at bedside and pt agreed to plan.  1:00 PM Patient updated on lab and imaging results.    Labs Review Labs Reviewed  BASIC METABOLIC PANEL - Abnormal; Notable for the following:    Sodium 133 (*)    Glucose, Bld 414 (*)    All other components within normal limits  URINALYSIS, ROUTINE W REFLEX MICROSCOPIC - Abnormal; Notable for the following:    Glucose, UA >1000 (*)    All other components within normal limits  D-DIMER, QUANTITATIVE  URINE MICROSCOPIC-ADD ON    Imaging Review Dg Chest 2 View  12/22/2014   CLINICAL DATA:  Cough, congestion starting 12/07/2014  EXAM: CHEST  2 VIEW  COMPARISON:  12/07/2014  FINDINGS: Cardiomediastinal silhouette is stable. Mild infrahilar increased bronchial markings right greater than left suspicious for bronchitic changes. No acute infiltrate or pulmonary edema.  IMPRESSION: No infiltrate or pulmonary edema. Mild infrahilar increased bronchial markings right greater than left suspicious for bronchitic changes.   Electronically Signed   By: Natasha Mead M.D.   On: 12/22/2014 11:07     EKG Interpretation None      MDM   Final diagnoses:  Cough  Hyperglycemia  coughing for the past one month that worsened today to the point where he felt something stuck in his throat. No chest pain or shortness of breath.  Chest x-ray negative for infiltrate. D-dimer negative. Cough likely viral bronchitis. Continue mucinex, tessalon, add albuterol.   Patient found to have sugar of 414. Denies history of diabetes but endorses drinking lots of sugary drinks and was told he may be diabetic. Informed needs close PCP follow-up. Normal  anion gap. No evidence of acidosis or DKA. Patient informed he is likely diabetic.  Will start metformin.  Patient states he will not take it and wishes to follow up with PCP.  I personally performed the services described in this documentation, which was scribed in my presence. The recorded information has been reviewed and is accurate.   Glynn Octave, MD 12/22/14 [id number]

## 2015-06-09 ENCOUNTER — Ambulatory Visit: Payer: Self-pay | Admitting: Orthopedic Surgery

## 2015-06-22 ENCOUNTER — Ambulatory Visit: Payer: Self-pay | Admitting: Orthopedic Surgery

## 2015-06-22 NOTE — H&P (Signed)
Trevor Medina is an 44 y.o. male.   Chief Complaint: back and leg pain HPI: The patient is a 44 year old male who presents with back pain. The patient is here today for a surgical consult and in referral from Dr. Ethelene Hal. The patient reports low back symptoms which began 8 month(s) ago following a specific injury. The injury occurred 07/11/2014 at work due to a fall (slipped on oil and fell landing on back and buttocks). Symptoms are reported to be located in the low back and Symptoms include pain. The pain radiates to the left thigh and right thigh. Prior to being seen today the patient was previously evaluated in the emergency room and by Dr. Ethelene Hal. Past evaluation has included x-ray of the lumbar spine and MRI of the lumbar spine. Past treatment has included opioid analgesics, muscle relaxants, corticosteroids and epidural injections. 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 Note for "Back pain": The patient currently has light duty work restrictions of no lifting over 5 lbs, no repetitive bending, stooping, squatting greater than 30 times per hour, no pushing or pulling and sedentary only.  Past Medical History  Diagnosis Date  . Chronic back pain     Past Surgical History  Procedure Laterality Date  . Ankle surgery      No family history on file. Social History:  reports that he has never smoked. He has never used smokeless tobacco. He reports that he drinks alcohol. He reports that he does not use illicit drugs.  Allergies: No Known Allergies   (Not in a hospital admission)  No results found for this or any previous visit (from the past 48 hour(s)). No results found.  Review of Systems  Constitutional: Negative.   HENT: Negative.   Eyes: Negative.   Respiratory: Negative.   Cardiovascular: Negative.   Gastrointestinal: Negative.   Genitourinary: Negative.   Musculoskeletal: Positive for back pain.  Skin: Negative.   Neurological: Positive for sensory  change and focal weakness.  Psychiatric/Behavioral: Negative.     There were no vitals taken for this visit. Physical Exam  Constitutional: He is oriented to person, place, and time. He appears well-developed.  HENT:  Head: Normocephalic.  Eyes: Pupils are equal, round, and reactive to light.  Neck: Normal range of motion.  Cardiovascular: Normal rate.   Respiratory: Effort normal.  GI: Soft.  Musculoskeletal:  He is in moderate distress. Mood and affect is appropriate. He has tender lumbosacral junction and the left and right proximal gluteus. His straight leg raise on the right produces marked buttock, thigh, calf pain, exacerbated with dorsal augmentation maneuvers. Trace EHL weakness on the right compared to the left. Diminished plantar flexion on the right, decreased sensation in the S1 dermatome.  Lumbar spine exam reveals no evidence of soft tissue swelling, deformity or skin ecchymosis. No flank pain with percussion. The abdomen is soft and nontender. Nontender over the trochanters. No cellulitis or lymphadenopathy.  Good range of motion of the lumbar spine without associated pain. Motor is 5/5 including tibialis anterior, quadriceps and hamstrings. Patient is normoreflexic. There is no Babinski or clonus. Patient has good distal pulses. No DVT. No pain and normal range of motion without instability of the hips, knees and ankles.  Inspection of the cervical spine reveals a normal lordosis without evidence of paraspinous spasms or soft tissue swelling. Nontender to palpation. Full flexion, full extension, full left and right lateral rotation. Extension combined with lateral flexion does not reproduce pain. Negative impingement  sign, negative secondary impingement sign of the shoulders. Negative Tinel's median and ulnar nerves at the elbow. Negative carpal compression test at the wrist. Motor of the upper extremities is 5/5 including biceps, triceps, brachioradialis, wrist flexion, wrist  extension, finger flexion, finger extension. Reflexes are normoreflexic. Sensory exam is intact to light touch. There is no Hoffmann sign. Nontender over the thoracic spine.  Neurological: He is alert and oriented to person, place, and time.    Three-view radiographs of the lumbar spine demonstrates moderate disc degeneration at L5-S1 with concomitant neural foraminal narrowing. No instability in flexion and extension. Hips are unremarkable.  MRI of the lumbar spine at outside facility demonstrates disc herniation at L5-S1 with moderately severe lateral recess stenosis at L5-S1 on the right, moderate on the left.  Assessment/Plan HNP/stenosis L5-S1  L5-S1 radiculopathy, secondary disc herniation, lateral recess stenosis at L5-S1. He does have increasing symptoms into the left lower extremity, most likely secondary to dynamic lateral recess stenosis as well. He does have some mechanical back pain and mild modic changes at L5-S1. We discussed options at this point. Living with his symptoms, he is eight months status post his injury with continued activity restriction and analgesics. He has had an injection with limited relief. One other option would be to consider a lumbar decompression at the L5-S1 on the right would suggest if he remains symptomatic on the left to consider decompression at L5-S1 on the left. We specifically stated that this would address his leg pain only and that continual modifications of his activity would be required to manages his disc degeneration long term, lifting restrictions activity modification, protective activities.  I had an extensive discussion of the risks and benefits of the lumbar decompression with the patient including bleeding, infection, damage to neurovascular structures, epidural fibrosis, CSF leak requiring repair. We also discussed increase in pain, adjacent segment disease, recurrent disc herniation, need for future surgery including repeat decompression  and/or fusion. We also discussed risks of postoperative hematoma, paralysis, anesthetic complications including DVT, PE, death, cardiopulmonary dysfunction. In addition, the perioperative and postoperative courses were discussed in detail including the rehabilitative time and return to functional activity and work. I provided the patient with an illustrated handout and utilized the appropriate surgical models.  We discussed possibly a fusion in the future. Again, 8 months status post, that is a reasonable option at this point. We discussed his particular injury. This is related to his Workmen's Compensation to his initial injury at work, which generated the disc herniation. We discussed this in extensive detail. Overnight in the hospital, two weeks of suture removal and physical therapy, at six weeks postop, light duty is currently sedentary and then at 10-12 weeks postop at maximum medical improvement and will consider at that point in time his return to his regular duty, would anticipate a return to a medium job classification. I appreciate the kind referral with Dr. Ramos. We will proceed accordingly. No history of MRSA or DVT.  Plan microlumbar decompression L5-S1 bilateral  BISSELL, JACLYN M. PA-C for Dr. Beane 06/22/2015, 9:09 AM    

## 2015-06-28 NOTE — Patient Instructions (Addendum)
YOUR PROCEDURE IS SCHEDULED ON :  07/07/15  REPORT TO [facility] MAIN ENTRANCE FOLLOW SIGNS TO EAST ELEVATOR - GO TO 3rd FLOOR CHECK IN AT 3 EAST NURSES STATION (SHORT STAY) AT:  9:00 AM  CALL THIS NUMBER IF YOU HAVE PROBLEMS THE MORNING OF SURGERY [phone]  REMEMBER:ONLY 1 PER PERSON MAY GO TO SHORT STAY WITH YOU TO GET READY THE MORNING OF YOUR SURGERY  DO NOT EAT FOOD OR DRINK LIQUIDS AFTER MIDNIGHT  TAKE THESE MEDICINES THE MORNING OF SURGERY: HYDROCODONE IF PAIN  YOU MAY NOT HAVE ANY METAL ON YOUR BODY INCLUDING HAIR PINS AND PIERCING'S. DO NOT WEAR JEWELRY, MAKEUP, LOTIONS, POWDERS OR PERFUMES. DO NOT WEAR NAIL POLISH. DO NOT SHAVE 48 HRS PRIOR TO SURGERY. MEN MAY SHAVE FACE AND NECK.  DO NOT BRING VALUABLES TO HOSPITAL. [facility] IS NOT RESPONSIBLE FOR VALUABLES.  CONTACTS, DENTURES OR PARTIALS MAY NOT BE WORN TO SURGERY. LEAVE SUITCASE IN CAR. CAN BE BROUGHT TO ROOM AFTER SURGERY.  PATIENTS DISCHARGED THE DAY OF SURGERY WILL NOT BE ALLOWED TO DRIVE HOME.  PLEASE READ OVER THE FOLLOWING INSTRUCTION SHEETS _________________________________________________________________________________                                          [facility] - PREPARING FOR SURGERY  Before surgery, you can play an important role.  Because skin is not sterile, your skin needs to be as free of germs as possible.  You can reduce the number of germs on your skin by washing with CHG (chlorahexidine gluconate) soap before surgery.  CHG is an antiseptic cleaner which kills germs and bonds with the skin to continue killing germs even after washing. Please DO NOT use if you have an allergy to CHG or antibacterial soaps.  If your skin becomes reddened/irritated stop using the CHG and inform your nurse when you arrive at Short Stay. Do not shave (including legs and underarms) for at least 48 hours prior to the first CHG shower.  You may shave your face. Please follow these  instructions carefully:   1.  Shower with CHG Soap the night before surgery and the  morning of Surgery.   2.  If you choose to wash your hair, wash your hair first as usual with your  normal  Shampoo.   3.  After you shampoo, rinse your hair and body thoroughly to remove the  shampoo.                                         4.  Use CHG as you would any other liquid soap.  You can apply chg directly  to the skin and wash . Gently wash with scrungie or clean wascloth    5.  Apply the CHG Soap to your body ONLY FROM THE NECK DOWN.   Do not use on open                           Wound or open sores. Avoid contact with eyes, ears mouth and genitals (private parts).                        Genitals (private parts) with your normal soap.  6.  Wash thoroughly, paying special attention to the area where your surgery  will be performed.   7.  Thoroughly rinse your body with warm water from the neck down.   8.  DO NOT shower/wash with your normal soap after using and rinsing off  the CHG Soap .                9.  Pat yourself dry with a clean towel.             10.  Wear clean night clothes to bed after shower             11.  Place clean sheets on your bed the night of your first shower and do not  sleep with pets.  Day of Surgery : Do not apply any lotions/deodorants the morning of surgery.  Please wear clean clothes to the hospital/surgery center.  FAILURE TO FOLLOW THESE INSTRUCTIONS MAY RESULT IN THE CANCELLATION OF YOUR SURGERY    PATIENT SIGNATURE_________________________________  ______________________________________________________________________     Trevor Medina  An incentive spirometer is a tool that can help keep your lungs clear and active. This tool measures how well you are filling your lungs with each breath. Taking long deep breaths may help reverse or decrease the chance of developing breathing (pulmonary) problems (especially infection)  following:  A long period of time when you are unable to move or be active. BEFORE THE PROCEDURE   If the spirometer includes an indicator to show your best effort, your nurse or respiratory therapist will set it to a desired goal.  If possible, sit up straight or lean slightly forward. Try not to slouch.  Hold the incentive spirometer in an upright position. INSTRUCTIONS FOR USE  1. Sit on the edge of your bed if possible, or sit up as far as you can in bed or on a chair. 2. Hold the incentive spirometer in an upright position. 3. Breathe out normally. 4. Place the mouthpiece in your mouth and seal your lips tightly around it. 5. Breathe in slowly and as deeply as possible, raising the piston or the ball toward the top of the column. 6. Hold your breath for 3-5 seconds or for as long as possible. Allow the piston or ball to fall to the bottom of the column. 7. Remove the mouthpiece from your mouth and breathe out normally. 8. Rest for a few seconds and repeat Steps 1 through 7 at least 10 times every 1-2 hours when you are awake. Take your time and take a few normal breaths between deep breaths. 9. The spirometer may include an indicator to show your best effort. Use the indicator as a goal to work toward during each repetition. 10. After each set of 10 deep breaths, practice coughing to be sure your lungs are clear. If you have an incision (the cut made at the time of surgery), support your incision when coughing by placing a pillow or rolled up towels firmly against it. Once you are able to get out of bed, walk around indoors and cough well. You may stop using the incentive spirometer when instructed by your caregiver.  RISKS AND COMPLICATIONS  Take your time so you do not get dizzy or light-headed.  If you are in pain, you may need to take or ask for pain medication before doing incentive spirometry. It is harder to take a deep breath if you are having pain. AFTER USE  Rest and  breathe slowly and  easily.  It can be helpful to keep track of a log of your progress. Your caregiver can provide you with a simple table to help with this. If you are using the spirometer at home, follow these instructions: [place] IF:   You are having difficultly using the spirometer.  You have trouble using the spirometer as often as instructed.  Your pain medication is not giving enough relief while using the spirometer.  You develop fever of 100.5 F (38.1 C) or higher. SEEK IMMEDIATE MEDICAL CARE IF:   You cough up bloody sputum that had not been present before.  You develop fever of 102 F (38.9 C) or greater.  You develop worsening pain at or near the incision site. MAKE SURE YOU:   Understand these instructions.  Will watch your condition.  Will get help right away if you are not doing well or get worse. Document Released: 01/29/2007 Document Revised: 12/11/2011 Document Reviewed: 04/01/2007 [facility] Patient Information 2014 [place], [place].   ________________________________________________________________________

## 2015-06-30 ENCOUNTER — Ambulatory Visit: Payer: Self-pay | Admitting: Orthopedic Surgery

## 2015-06-30 ENCOUNTER — Encounter (HOSPITAL_COMMUNITY): Payer: Self-pay

## 2015-06-30 ENCOUNTER — Encounter (HOSPITAL_COMMUNITY)
Admission: RE | Admit: 2015-06-30 | Discharge: 2015-06-30 | Disposition: A | Discharge status: Home / Self Care | Payer: Worker's Compensation | Source: Ambulatory Visit | Attending: Specialist | Admitting: Specialist

## 2015-06-30 ENCOUNTER — Ambulatory Visit (HOSPITAL_COMMUNITY)
Admission: RE | Admit: 2015-06-30 | Discharge: 2015-06-30 | Disposition: A | Discharge status: Home / Self Care | Payer: 59 | Source: Ambulatory Visit | Attending: Orthopedic Surgery | Admitting: Orthopedic Surgery

## 2015-06-30 ENCOUNTER — Other Ambulatory Visit: Payer: Self-pay

## 2015-06-30 DIAGNOSIS — Z01818 Encounter for other preprocedural examination: Secondary | ICD-10-CM | POA: Diagnosis not present

## 2015-06-30 DIAGNOSIS — M5126 Other intervertebral disc displacement, lumbar region: Secondary | ICD-10-CM | POA: Insufficient documentation

## 2015-06-30 HISTORY — DX: Other intervertebral disc displacement, lumbar region: M51.26

## 2015-06-30 HISTORY — DX: Type 2 diabetes mellitus without complications: E11.9

## 2015-06-30 HISTORY — DX: Spinal stenosis, site unspecified: M48.00

## 2015-06-30 HISTORY — DX: Personal history of other diseases of the musculoskeletal system and connective tissue: Z87.39

## 2015-06-30 LAB — BASIC METABOLIC PANEL
ANION GAP: 9 (ref 5–15)
BUN: 14 mg/dL (ref 6–20)
CALCIUM: 9.5 mg/dL (ref 8.9–10.3)
CHLORIDE: 99 mmol/L — AB (ref 101–111)
CO2: 24 mmol/L (ref 22–32)
Creatinine, Ser: 0.7 mg/dL (ref 0.61–1.24)
GFR calc non Af Amer: >60 mL/min (ref >60)
Glucose, Bld: 280 mg/dL — ABNORMAL HIGH (ref 65–99)
Potassium: 4.4 mmol/L (ref 3.5–5.1)
Sodium: 132 mmol/L — ABNORMAL LOW (ref 135–145)

## 2015-06-30 LAB — CBC
HCT: 41.3 % (ref 39.0–52.0)
HEMOGLOBIN: 14 g/dL (ref 13.0–17.0)
MCH: 26.2 pg (ref 26.0–34.0)
MCHC: 33.9 g/dL (ref 30.0–36.0)
MCV: 77.3 fL — ABNORMAL LOW (ref 78.0–100.0)
Platelets: 290 10*3/uL (ref 150–400)
RBC: 5.34 MIL/uL (ref 4.22–5.81)
RDW: 14.1 % (ref 11.5–15.5)
WBC: 5.7 10*3/uL (ref 4.0–10.5)

## 2015-06-30 LAB — SURGICAL PCR SCREEN
MRSA, PCR: NEGATIVE
Staphylococcus aureus: NEGATIVE

## 2015-06-30 NOTE — Progress Notes (Signed)
I spoke with Trevor Pomfret PA concerning elevated glucose. She said to recheck glucose am of surgery and pt may need to be seen by medical doctor while in hospital for treatment of diabetes. Pt has no PCP. Was given Rx for Metformin in March at an urgent care but did not get Rx filled.pt stated he planned to treat with "diet and exercise" after back surgery.

## 2015-06-30 NOTE — Progress Notes (Signed)
ABNORMAL BMET faxed to Dr.Beane

## 2015-07-07 ENCOUNTER — Ambulatory Visit (HOSPITAL_COMMUNITY): Payer: Worker's Compensation

## 2015-07-07 ENCOUNTER — Encounter (HOSPITAL_COMMUNITY): Payer: Self-pay | Admitting: *Deleted

## 2015-07-07 ENCOUNTER — Ambulatory Visit (HOSPITAL_COMMUNITY)
Admission: RE | Admit: 2015-07-07 | Discharge: 2015-07-08 | Disposition: A | Discharge status: Home / Self Care | Payer: Worker's Compensation | Source: Ambulatory Visit | Attending: Specialist | Admitting: Specialist

## 2015-07-07 ENCOUNTER — Encounter (HOSPITAL_COMMUNITY)
Admission: RE | Disposition: A | Discharge status: Home / Self Care | Payer: Self-pay | Source: Ambulatory Visit | Attending: Specialist

## 2015-07-07 ENCOUNTER — Ambulatory Visit (HOSPITAL_COMMUNITY): Payer: Worker's Compensation | Admitting: Certified Registered Nurse Anesthetist

## 2015-07-07 DIAGNOSIS — Z9111 Patient's noncompliance with dietary regimen: Secondary | ICD-10-CM | POA: Insufficient documentation

## 2015-07-07 DIAGNOSIS — E1165 Type 2 diabetes mellitus with hyperglycemia: Secondary | ICD-10-CM | POA: Diagnosis not present

## 2015-07-07 DIAGNOSIS — E0865 Diabetes mellitus due to underlying condition with hyperglycemia: Secondary | ICD-10-CM | POA: Diagnosis not present

## 2015-07-07 DIAGNOSIS — IMO0002 Reserved for concepts with insufficient information to code with codable children: Secondary | ICD-10-CM | POA: Diagnosis present

## 2015-07-07 DIAGNOSIS — M5116 Intervertebral disc disorders with radiculopathy, lumbar region: Secondary | ICD-10-CM | POA: Diagnosis not present

## 2015-07-07 DIAGNOSIS — M4807 Spinal stenosis, lumbosacral region: Secondary | ICD-10-CM | POA: Diagnosis not present

## 2015-07-07 DIAGNOSIS — Z79899 Other long term (current) drug therapy: Secondary | ICD-10-CM | POA: Diagnosis not present

## 2015-07-07 DIAGNOSIS — Z791 Long term (current) use of non-steroidal anti-inflammatories (NSAID): Secondary | ICD-10-CM | POA: Diagnosis not present

## 2015-07-07 DIAGNOSIS — M5126 Other intervertebral disc displacement, lumbar region: Secondary | ICD-10-CM | POA: Diagnosis not present

## 2015-07-07 DIAGNOSIS — Z6831 Body mass index (BMI) 31.0-31.9, adult: Secondary | ICD-10-CM | POA: Insufficient documentation

## 2015-07-07 DIAGNOSIS — Z79891 Long term (current) use of opiate analgesic: Secondary | ICD-10-CM | POA: Insufficient documentation

## 2015-07-07 DIAGNOSIS — E669 Obesity, unspecified: Secondary | ICD-10-CM | POA: Diagnosis not present

## 2015-07-07 DIAGNOSIS — Z419 Encounter for procedure for purposes other than remedying health state, unspecified: Secondary | ICD-10-CM

## 2015-07-07 HISTORY — PX: LUMBAR LAMINECTOMY/DECOMPRESSION MICRODISCECTOMY: SHX5026

## 2015-07-07 LAB — URINALYSIS, ROUTINE W REFLEX MICROSCOPIC
Bilirubin Urine: NEGATIVE
HGB URINE DIPSTICK: NEGATIVE
Ketones, ur: NEGATIVE mg/dL
LEUKOCYTES UA: NEGATIVE
Nitrite: NEGATIVE
PH: 5 (ref 5.0–8.0)
PROTEIN: 30 mg/dL — AB
SPECIFIC GRAVITY, URINE: 1.023 (ref 1.005–1.030)
Urobilinogen, UA: 0.2 mg/dL (ref 0.0–1.0)

## 2015-07-07 LAB — GLUCOSE, RANDOM: Glucose, Bld: 303 mg/dL — ABNORMAL HIGH (ref 65–99)

## 2015-07-07 LAB — GLUCOSE, CAPILLARY
GLUCOSE-CAPILLARY: 272 mg/dL — AB (ref 65–99)
GLUCOSE-CAPILLARY: 227 mg/dL — AB (ref 65–99)
GLUCOSE-CAPILLARY: 197 mg/dL — AB (ref 65–99)
Glucose-Capillary: 172 mg/dL — ABNORMAL HIGH (ref 65–99)

## 2015-07-07 LAB — URINE MICROSCOPIC-ADD ON: URINE-OTHER: NONE SEEN

## 2015-07-07 SURGERY — LUMBAR LAMINECTOMY/DECOMPRESSION MICRODISCECTOMY 1 LEVEL
Anesthesia: General | Laterality: Bilateral

## 2015-07-07 MED ORDER — NEOSTIGMINE METHYLSULFATE 10 MG/10ML IV SOLN
INTRAVENOUS | Status: DC | PRN
Start: 2015-07-07 — End: 2015-07-07
  Administered 2015-07-07: 4 mg via INTRAVENOUS

## 2015-07-07 MED ORDER — ALBUTEROL SULFATE (2.5 MG/3ML) 0.083% IN NEBU: 3.0000 mL | INHALATION_SOLUTION | Freq: Four times a day (QID) | RESPIRATORY_TRACT | Status: DC | PRN

## 2015-07-07 MED ORDER — RISAQUAD PO CAPS
1.0000 | ORAL_CAPSULE | Freq: Every day | ORAL | Status: DC
  Filled 2015-07-07 (×2): qty 1

## 2015-07-07 MED ORDER — ALUM & MAG HYDROXIDE-SIMETH 200-200-20 MG/5ML PO SUSP: 30.0000 mL | Freq: Four times a day (QID) | ORAL | Status: DC | PRN

## 2015-07-07 MED ORDER — INSULIN GLARGINE 100 UNIT/ML ~~LOC~~ SOLN
15.0000 [IU] | Freq: Every day | SUBCUTANEOUS | Status: DC
  Administered 2015-07-07: 15 [IU] via SUBCUTANEOUS
  Filled 2015-07-07: qty 0.15

## 2015-07-07 MED ORDER — SODIUM CHLORIDE 0.9 % IV SOLN
10.0000 mg | INTRAVENOUS | Status: DC | PRN
  Administered 2015-07-07: 50 ug/min via INTRAVENOUS

## 2015-07-07 MED ORDER — MIDAZOLAM HCL 5 MG/5ML IJ SOLN
INTRAMUSCULAR | Status: DC | PRN
  Administered 2015-07-07: 2 mg via INTRAVENOUS

## 2015-07-07 MED ORDER — PROPOFOL 10 MG/ML IV BOLUS
INTRAVENOUS | Status: DC | PRN
  Administered 2015-07-07: 200 mg via INTRAVENOUS

## 2015-07-07 MED ORDER — MIDAZOLAM HCL 2 MG/2ML IJ SOLN
INTRAMUSCULAR | Status: AC
  Filled 2015-07-07: qty 4

## 2015-07-07 MED ORDER — INSULIN ASPART 100 UNIT/ML ~~LOC~~ SOLN
SUBCUTANEOUS | Status: AC
  Administered 2015-07-07: 3 [IU] via SUBCUTANEOUS
  Filled 2015-07-07: qty 1

## 2015-07-07 MED ORDER — LACTATED RINGERS IV SOLN
INTRAVENOUS | Status: DC
Start: 2015-07-07 — End: 2015-07-07
  Administered 2015-07-07: 1000 mL via INTRAVENOUS
  Administered 2015-07-07: 12:00:00 via INTRAVENOUS

## 2015-07-07 MED ORDER — INSULIN ASPART 100 UNIT/ML ~~LOC~~ SOLN
0.0000 [IU] | Freq: Three times a day (TID) | SUBCUTANEOUS | Status: DC
  Administered 2015-07-07: 3 [IU] via SUBCUTANEOUS

## 2015-07-07 MED ORDER — THROMBIN 5000 UNITS EX SOLR
CUTANEOUS | Status: AC
  Filled 2015-07-07: qty 10000

## 2015-07-07 MED ORDER — CEFAZOLIN SODIUM-DEXTROSE 2-3 GM-% IV SOLR
2.0000 g | Freq: Three times a day (TID) | INTRAVENOUS | Status: AC
  Administered 2015-07-07 – 2015-07-08 (×3): 2 g via INTRAVENOUS
  Filled 2015-07-07 (×3): qty 50

## 2015-07-07 MED ORDER — ONDANSETRON HCL 4 MG/2ML IJ SOLN: 4.0000 mg | INTRAMUSCULAR | Status: DC | PRN

## 2015-07-07 MED ORDER — BISACODYL 5 MG PO TBEC: 5.0000 mg | DELAYED_RELEASE_TABLET | Freq: Every day | ORAL | Status: DC | PRN

## 2015-07-07 MED ORDER — PHENYLEPHRINE HCL 10 MG/ML IJ SOLN
INTRAMUSCULAR | Status: DC | PRN
  Administered 2015-07-07: 80 ug via INTRAVENOUS
  Administered 2015-07-07: 120 ug via INTRAVENOUS
  Administered 2015-07-07 (×2): 80 ug via INTRAVENOUS

## 2015-07-07 MED ORDER — CEFAZOLIN SODIUM-DEXTROSE 2-3 GM-% IV SOLR
2.0000 g | INTRAVENOUS | Status: AC
  Administered 2015-07-07: 2 g via INTRAVENOUS

## 2015-07-07 MED ORDER — OXYCODONE-ACETAMINOPHEN 5-325 MG PO TABS
1.0000 | ORAL_TABLET | ORAL | Status: DC | PRN
  Administered 2015-07-07 – 2015-07-08 (×5): 2 via ORAL
  Filled 2015-07-07 (×5): qty 2

## 2015-07-07 MED ORDER — OXYCODONE-ACETAMINOPHEN 5-325 MG PO TABS: 1.0000 | ORAL_TABLET | ORAL | Status: DC | PRN

## 2015-07-07 MED ORDER — THROMBIN 5000 UNITS EX SOLR
OROMUCOSAL | Status: DC | PRN
  Administered 2015-07-07: 12:00:00 via TOPICAL

## 2015-07-07 MED ORDER — ONDANSETRON HCL 4 MG/2ML IJ SOLN
INTRAMUSCULAR | Status: DC | PRN
  Administered 2015-07-07: 4 mg via INTRAVENOUS

## 2015-07-07 MED ORDER — INSULIN ASPART 100 UNIT/ML ~~LOC~~ SOLN
8.0000 [IU] | Freq: Once | SUBCUTANEOUS | Status: AC
  Administered 2015-07-07: 8 [IU] via SUBCUTANEOUS

## 2015-07-07 MED ORDER — INSULIN ASPART 100 UNIT/ML ~~LOC~~ SOLN
SUBCUTANEOUS | Status: AC
  Filled 2015-07-07: qty 1

## 2015-07-07 MED ORDER — HYDROMORPHONE HCL 1 MG/ML IJ SOLN
INTRAMUSCULAR | Status: AC
  Administered 2015-07-07: 0.5 mg via INTRAVENOUS
  Filled 2015-07-07: qty 1

## 2015-07-07 MED ORDER — POTASSIUM CHLORIDE IN NACL 20-0.45 MEQ/L-% IV SOLN
INTRAVENOUS | Status: DC
  Administered 2015-07-07: 50 mL/h via INTRAVENOUS
  Filled 2015-07-07 (×2): qty 1000

## 2015-07-07 MED ORDER — PHENOL 1.4 % MT LIQD: 1.0000 | OROMUCOSAL | Status: DC | PRN

## 2015-07-07 MED ORDER — PROPOFOL 10 MG/ML IV BOLUS
INTRAVENOUS | Status: AC
  Filled 2015-07-07: qty 20

## 2015-07-07 MED ORDER — HYDROMORPHONE HCL 1 MG/ML IJ SOLN
0.5000 mg | INTRAMUSCULAR | Status: DC | PRN
  Administered 2015-07-07: 0.5 mg via INTRAVENOUS
  Filled 2015-07-07: qty 1

## 2015-07-07 MED ORDER — BUPIVACAINE-EPINEPHRINE 0.5% -1:200000 IJ SOLN
INTRAMUSCULAR | Status: DC | PRN
  Administered 2015-07-07: 11 mL

## 2015-07-07 MED ORDER — METHOCARBAMOL 500 MG PO TABS: 500.0000 mg | ORAL_TABLET | Freq: Three times a day (TID) | ORAL | Status: DC

## 2015-07-07 MED ORDER — INSULIN ASPART 100 UNIT/ML ~~LOC~~ SOLN
6.0000 [IU] | Freq: Once | SUBCUTANEOUS | Status: AC
  Administered 2015-07-07: 6 [IU] via SUBCUTANEOUS

## 2015-07-07 MED ORDER — ACETAMINOPHEN 325 MG PO TABS: 650.0000 mg | ORAL_TABLET | ORAL | Status: DC | PRN

## 2015-07-07 MED ORDER — MAGNESIUM CITRATE PO SOLN: 1.0000 | Freq: Once | ORAL | Status: DC | PRN

## 2015-07-07 MED ORDER — LIDOCAINE HCL (CARDIAC) 20 MG/ML IV SOLN
INTRAVENOUS | Status: DC | PRN
  Administered 2015-07-07: 50 mg via INTRAVENOUS

## 2015-07-07 MED ORDER — BUPIVACAINE-EPINEPHRINE (PF) 0.5% -1:200000 IJ SOLN
INTRAMUSCULAR | Status: AC
  Filled 2015-07-07: qty 30

## 2015-07-07 MED ORDER — ACETAMINOPHEN 650 MG RE SUPP: 650.0000 mg | RECTAL | Status: DC | PRN

## 2015-07-07 MED ORDER — METHOCARBAMOL 1000 MG/10ML IJ SOLN
500.0000 mg | Freq: Four times a day (QID) | INTRAVENOUS | Status: DC | PRN
  Administered 2015-07-07: 500 mg via INTRAVENOUS
  Filled 2015-07-07 (×2): qty 5

## 2015-07-07 MED ORDER — ROCURONIUM BROMIDE 100 MG/10ML IV SOLN
INTRAVENOUS | Status: DC | PRN
  Administered 2015-07-07 (×2): 20 mg via INTRAVENOUS
  Administered 2015-07-07: 30 mg via INTRAVENOUS

## 2015-07-07 MED ORDER — LACTATED RINGERS IV SOLN: INTRAVENOUS | Status: DC

## 2015-07-07 MED ORDER — GLYCOPYRROLATE 0.2 MG/ML IJ SOLN
INTRAMUSCULAR | Status: DC | PRN
  Administered 2015-07-07: .6 mg via INTRAVENOUS

## 2015-07-07 MED ORDER — DOCUSATE SODIUM 100 MG PO CAPS: 100.0000 mg | ORAL_CAPSULE | Freq: Two times a day (BID) | ORAL | Status: DC | PRN

## 2015-07-07 MED ORDER — PHENYLEPHRINE 40 MCG/ML (10ML) SYRINGE FOR IV PUSH (FOR BLOOD PRESSURE SUPPORT)
PREFILLED_SYRINGE | INTRAVENOUS | Status: AC
  Filled 2015-07-07: qty 10

## 2015-07-07 MED ORDER — MENTHOL 3 MG MT LOZG: 1.0000 | LOZENGE | OROMUCOSAL | Status: DC | PRN

## 2015-07-07 MED ORDER — SENNOSIDES-DOCUSATE SODIUM 8.6-50 MG PO TABS: 1.0000 | ORAL_TABLET | Freq: Every evening | ORAL | Status: DC | PRN

## 2015-07-07 MED ORDER — SUCCINYLCHOLINE CHLORIDE 20 MG/ML IJ SOLN
INTRAMUSCULAR | Status: DC | PRN
  Administered 2015-07-07: 100 mg via INTRAVENOUS

## 2015-07-07 MED ORDER — SODIUM CHLORIDE 0.9 % IR SOLN
Status: DC | PRN
  Administered 2015-07-07: 500 mL

## 2015-07-07 MED ORDER — FENTANYL CITRATE (PF) 250 MCG/5ML IJ SOLN
INTRAMUSCULAR | Status: AC
  Filled 2015-07-07: qty 25

## 2015-07-07 MED ORDER — HYDROMORPHONE HCL 1 MG/ML IJ SOLN
INTRAMUSCULAR | Status: AC
  Filled 2015-07-07: qty 1

## 2015-07-07 MED ORDER — HYDROMORPHONE HCL 1 MG/ML IJ SOLN
0.2500 mg | INTRAMUSCULAR | Status: DC | PRN
  Administered 2015-07-07 (×3): 0.5 mg via INTRAVENOUS

## 2015-07-07 MED ORDER — HYDROCODONE-ACETAMINOPHEN 5-325 MG PO TABS: 1.0000 | ORAL_TABLET | ORAL | Status: DC | PRN

## 2015-07-07 MED ORDER — SODIUM CHLORIDE 0.9 % IR SOLN
Status: AC
  Filled 2015-07-07: qty 1

## 2015-07-07 MED ORDER — METFORMIN HCL 500 MG PO TABS: 500.0000 mg | ORAL_TABLET | Freq: Two times a day (BID) | ORAL | Status: DC

## 2015-07-07 MED ORDER — PHENYLEPHRINE HCL 10 MG/ML IJ SOLN
INTRAMUSCULAR | Status: AC
  Filled 2015-07-07: qty 1

## 2015-07-07 MED ORDER — METHOCARBAMOL 500 MG PO TABS
500.0000 mg | ORAL_TABLET | Freq: Four times a day (QID) | ORAL | Status: DC | PRN
  Administered 2015-07-07 – 2015-07-08 (×2): 500 mg via ORAL
  Filled 2015-07-07 (×2): qty 1

## 2015-07-07 MED ORDER — CEFAZOLIN SODIUM-DEXTROSE 2-3 GM-% IV SOLR
INTRAVENOUS | Status: AC
  Filled 2015-07-07: qty 50

## 2015-07-07 MED ORDER — FENTANYL CITRATE (PF) 100 MCG/2ML IJ SOLN
INTRAMUSCULAR | Status: DC | PRN
  Administered 2015-07-07: 100 ug via INTRAVENOUS

## 2015-07-07 MED ORDER — INSULIN ASPART 100 UNIT/ML ~~LOC~~ SOLN
3.0000 [IU] | Freq: Three times a day (TID) | SUBCUTANEOUS | Status: DC
  Administered 2015-07-07: 3 [IU] via SUBCUTANEOUS

## 2015-07-07 SURGICAL SUPPLY — 48 items
BAG ZIPLOCK 12X15 (MISCELLANEOUS) IMPLANT
CLEANER TIP ELECTROSURG 2X2 (MISCELLANEOUS) ×3 IMPLANT
CLOSURE WOUND 1/2 X4 (GAUZE/BANDAGES/DRESSINGS) ×1
CLOTH 2% CHLOROHEXIDINE 3PK (PERSONAL CARE ITEMS) ×3 IMPLANT
DRAPE MICROSCOPE LEICA (MISCELLANEOUS) ×3 IMPLANT
DRAPE POUCH INSTRU U-SHP 10X18 (DRAPES) ×3 IMPLANT
DRAPE SHEET LG 3/4 BI-LAMINATE (DRAPES) ×3 IMPLANT
DRAPE SURG 17X11 SM STRL (DRAPES) ×3 IMPLANT
DRAPE UTILITY XL STRL (DRAPES) ×3 IMPLANT
DRSG AQUACEL AG ADV 3.5X 4 (GAUZE/BANDAGES/DRESSINGS) ×3 IMPLANT
DRSG AQUACEL AG ADV 3.5X 6 (GAUZE/BANDAGES/DRESSINGS) IMPLANT
DURAPREP 26ML APPLICATOR (WOUND CARE) ×3 IMPLANT
DURASEAL SPINE SEALANT 3ML (MISCELLANEOUS) IMPLANT
ELECT BLADE TIP CTD 4 INCH (ELECTRODE) IMPLANT
ELECT REM PT RETURN 9FT ADLT (ELECTROSURGICAL) ×3
ELECTRODE REM PT RTRN 9FT ADLT (ELECTROSURGICAL) ×1 IMPLANT
GLOVE BIOGEL PI IND STRL 7.0 (GLOVE) ×1 IMPLANT
GLOVE BIOGEL PI INDICATOR 7.0 (GLOVE) ×2
GLOVE SURG SS PI 7.0 STRL IVOR (GLOVE) ×3 IMPLANT
GLOVE SURG SS PI 7.5 STRL IVOR (GLOVE) ×3 IMPLANT
GLOVE SURG SS PI 8.0 STRL IVOR (GLOVE) ×6 IMPLANT
GOWN STRL REUS W/TWL XL LVL3 (GOWN DISPOSABLE) ×6 IMPLANT
IV CATH 14GX2 1/4 (CATHETERS) ×3 IMPLANT
KIT BASIN OR (CUSTOM PROCEDURE TRAY) ×3 IMPLANT
KIT POSITIONING SURG ANDREWS (MISCELLANEOUS) ×3 IMPLANT
MANIFOLD NEPTUNE II (INSTRUMENTS) ×3 IMPLANT
NEEDLE SPNL 18GX3.5 QUINCKE PK (NEEDLE) ×6 IMPLANT
PACK LAMINECTOMY ORTHO (CUSTOM PROCEDURE TRAY) ×3 IMPLANT
PATTIES SURGICAL .5 X.5 (GAUZE/BANDAGES/DRESSINGS) IMPLANT
PATTIES SURGICAL .75X.75 (GAUZE/BANDAGES/DRESSINGS) IMPLANT
PATTIES SURGICAL 1X1 (DISPOSABLE) IMPLANT
RUBBERBAND STERILE (MISCELLANEOUS) ×6 IMPLANT
SPONGE SURGIFOAM ABS GEL 100 (HEMOSTASIS) ×3 IMPLANT
STAPLER VISISTAT (STAPLE) IMPLANT
STRIP CLOSURE SKIN 1/2X4 (GAUZE/BANDAGES/DRESSINGS) ×2 IMPLANT
SUT NURALON 4 0 TR CR/8 (SUTURE) IMPLANT
SUT PROLENE 3 0 PS 2 (SUTURE) ×3 IMPLANT
SUT VIC AB 1 CT1 27 (SUTURE)
SUT VIC AB 1 CT1 27XBRD ANTBC (SUTURE) IMPLANT
SUT VIC AB 1-0 CT2 27 (SUTURE) ×3 IMPLANT
SUT VIC AB 2-0 CT1 27 (SUTURE)
SUT VIC AB 2-0 CT1 TAPERPNT 27 (SUTURE) IMPLANT
SUT VIC AB 2-0 CT2 27 (SUTURE) ×3 IMPLANT
SYR 3ML LL SCALE MARK (SYRINGE) IMPLANT
TAPE STRIPS DRAPE STRL (GAUZE/BANDAGES/DRESSINGS) ×3 IMPLANT
TOWEL OR 17X26 10 PK STRL BLUE (TOWEL DISPOSABLE) ×3 IMPLANT
TOWEL OR NON WOVEN STRL DISP B (DISPOSABLE) IMPLANT
YANKAUER SUCT BULB TIP NO VENT (SUCTIONS) IMPLANT

## 2015-07-07 NOTE — Transfer of Care (Signed)
Immediate Anesthesia Transfer of Care Note  Patient: Trevor Medina  Procedure(s) Performed: Procedure(s): DECOMPRESSION L5-S1 BILATERALLY (Bilateral)  Patient Location: PACU  Anesthesia Type:General  Level of Consciousness: awake, alert  and oriented  Airway & Oxygen Therapy: Patient Spontanous Breathing and Patient connected to face mask oxygen  Post-op Assessment: Report given to RN  Post vital signs: Reviewed and stable  Last Vitals:  Filed Vitals:   07/07/15 0843  BP: 153/91  Pulse: 96  Temp: 36.9 C  Resp: 18    Complications: No apparent anesthesia complications

## 2015-07-07 NOTE — Consult Note (Signed)
Requesting physician: Dr Tinnie Gens beane  Reason for consultation: Hyperglycemia  History of Present Illness: 44 year old obese male with history of lumbar herniated nucleus pulposus following an injury at work about 1 year back, with ongoing back pain not resolved with pain control underwent L5-S1 bilateral decompression today. Postop patient was found to have fingersticks of 300. Hospitalist consulted for evaluation and management. Patient was diagnosed to have diabetes mellitus in March of this year when he presented for ongoing cough symptoms. He had a blood glucose of 414 without any and an gap on labs. He was given a prescription of metformin and discharged home. He reports that he never to the medication. He informs that he drinks a lot of sugary drinks and eats a lot of pastas. He denies any polyuria, polydipsia, blurred vision, nausea, vomiting, tingling or numbness of his extremities, chest pain, shortness of breath, palpitations, abdominal pain, fevers or chills. Denies any bowel symptoms. Reports his father had diabetes but does not know further about it. He reports that he has not been able to exercise due to ongoing back pain.    Allergies:  No Known Allergies    Past Medical History  Diagnosis Date  . Chronic back pain   . HNP (herniated nucleus pulposus), lumbar   . Spinal stenosis   . History of gout   . Diabetes mellitus without complication (HCC)     no meds - was given Rx for Metformin in March 2016 at urgent care (BGlucose 414) but never filled Rx. Planned to treat with "diet and exercise"    Past Surgical History  Procedure Laterality Date  . Ankle surgery      Medications:  Scheduled Meds: . acidophilus  1 capsule Oral Daily  .  ceFAZolin (ANCEF) IV  2 g Intravenous Q8H  . HYDROmorphone      . HYDROmorphone      . insulin aspart      . insulin aspart  0-15 Units Subcutaneous TID WC   Continuous Infusions: . 0.45 % NaCl with KCl 20 mEq / L 50 mL/hr  (07/07/15 1517)   PRN Meds:.acetaminophen **OR** acetaminophen, albuterol, alum & mag hydroxide-simeth, bisacodyl, HYDROcodone-acetaminophen, HYDROmorphone (DILAUDID) injection, magnesium citrate, menthol-cetylpyridinium **OR** phenol, methocarbamol **OR** methocarbamol (ROBAXIN)  IV, ondansetron (ZOFRAN) IV, oxyCODONE-acetaminophen, senna-docusate  Social History:  reports that he has never smoked. He has never used smokeless tobacco. He reports that he drinks alcohol. (4-6 beers on weekends)  He reports that he does not use illicit drugs.   family history Father had diabetes  Review of Systems:  Constitutional: Denies fever, chills, diaphoresis, appetite change and fatigue.  HEENT: Denievisual or hearing symptoms, trouble swallowing, neck pain or stiffness  Respiratory: denies SOB, DOE, cough, chest tightness,  and wheezing.   Cardiovascular: Denies chest pain, palpitations and leg swelling.  Gastrointestinal: Denies nausea, vomiting, abdominal pain, diarrhea,  blood in stool and abdominal distention.  Genitourinary: Denies dysuria, urgency, frequency, hematuria, flank pain and difficulty urinating.  Endocrine: Denies: hot or cold intolerance, polyuria, polydipsia. Musculoskeletal: chronic back pain,  Denies pallor, rash and wound.  Neurological: Denies dizziness, weakness, light-headedness, numbness and headaches.  Hematological: Denies adenopathy.  Psychiatric/Behavioral: Denies confusion,    Physical Exam:  Filed Vitals:   07/07/15 1400 07/07/15 1415 07/07/15 1445 07/07/15 1536  BP: 163/79 155/80 141/84 126/77  Pulse: 76 77 74 88  Temp:  98.5 F (36.9 C) 98.7 F (37.1 C) 98.4 F (36.9 C)  TempSrc:   Oral Oral  Resp: 18 20 18  19  Height:    (1.778 m)   Weight:   98.884 kg (218 lb)   SpO2: 100% 99% 98% 98%     Intake/Output Summary (Last 24 hours) at 07/07/15 1553 Last data filed at 07/07/15 1536  Gross per 24 hour  Intake   1820 ml  Output      0 ml  Net    1820 ml    General: Middle aged obese male in no acute distress HEENT: No pallor, moist oral mucosa, supple Chest: Clear to auscultation bilaterally, no added sounds CVS: Normal S1 and S2, no murmurs rub or gallop GI: Soft, nondistended, nontender, bowel sounds present Musculoskeletal: Warm, no leg edema, dressing over lower lumbar surgical area clean  CNS: Alert and oriented   Labs on Admission:  CBC:    Component Value Date/Time   WBC 5.7 06/30/2015 1130   HGB 14.0 06/30/2015 1130   HCT 41.3 06/30/2015 1130   PLT 290 06/30/2015 1130   MCV 77.3* 06/30/2015 1130    Basic Metabolic Panel:    Component Value Date/Time   NA 132* 06/30/2015 1130   K 4.4 06/30/2015 1130   CL 99* 06/30/2015 1130   CO2 24 06/30/2015 1130   BUN 14 06/30/2015 1130   CREATININE 0.70 06/30/2015 1130   GLUCOSE 303* 07/07/2015 0920   CALCIUM 9.5 06/30/2015 1130    Radiological Exams on Admission: Dg Spine Portable 1 View  07/07/2015   CLINICAL DATA:  L5-S1 decompression.  EXAM: PORTABLE SPINE - 1 VIEW  COMPARISON:  Same day.  FINDINGS: Single intraoperative cross-table lateral projection of lumbar spine demonstrates surgical probe directed toward posterior margin of L5-S1 disc space.  IMPRESSION: Surgical localization as described above.   Electronically Signed   By: Lupita Raider, M.D.   On: 07/07/2015 12:17   Dg Spine Portable 1 View  07/07/2015   CLINICAL DATA:  L5-S1 decompression.  EXAM: PORTABLE SPINE - 1 VIEW  COMPARISON:  Same day.  FINDINGS: Single intraoperative cross-table lateral projection of the lumbar spine demonstrates surgical probe directed toward posterior margin of L5-S1 disc space.  IMPRESSION: Surgical localization as described above.   Electronically Signed   By: Lupita Raider, M.D.   On: 07/07/2015 11:47   Dg Spine Portable 1 View  07/07/2015   CLINICAL DATA:  L5-S1 lumbar decompression.  EXAM: PORTABLE SPINE - 1 VIEW  COMPARISON:  June 30, 2015.  FINDINGS: Single  intraoperative cross-table lateral projection of the lumbar spine was obtained. Surgical probes are seen directed toward the posterior spinous processes of L4 and L5.  IMPRESSION: Surgical localization as described above.   Electronically Signed   By: Lupita Raider, M.D.   On: 07/07/2015 11:45    Assessment/Plan Principal problem Uncontrolled diabetes mellitus Patient was diagnosed about 6 months back randomly and prescribed  metformin in the ED but never filled. Has dietary non adherence. Check A1c. Will start him on Lantus 15 units at bedtime along with pre-meal aspart 3 units 3 times a day with meals. Continue sliding scale monitoring. -Counseled extensively on dietary adherence, last modification to reduce weight and possible need for oral hypoglycemic along with insulin. Patient is not interested in starting insulin at this time and wants to lose weight and adhere to strict diet regimen to control his blood glucose. -Will  provide him with educational resources on meal planning and blood glucose monitoring. -Patient will need a glucometer and other diabetic supplies upon discharge. Diabetic coordinator following. -Check  lipid panel in a.m. -check UA. Will add ACEi if proteinuria +    obesity Counseled on weight loss and exercise  Herniated nucleus pulposus Status post L5-S1 decompression surgery. Pain control and further plan per primary team    Diet: Diabetic DVT prophylaxis: Subcutaneous Lovenox   Patient will need to establish care with a PCP in the community. Will ask CM to assist in establishing care at wellness center or transition care clinic. I will follow again in the morning. Please call for any questions.   Time Spent on Admission: 50 minutes  Katilin Raynes 07/07/2015, 3:53 PM

## 2015-07-07 NOTE — Anesthesia Preprocedure Evaluation (Addendum)
Anesthesia Evaluation  Patient identified by MRN, date of birth, ID band Patient awake    Reviewed: Allergy & Precautions, NPO status , Patient's Chart, lab work & pertinent test results  Airway Mallampati: II  TM Distance: >3 FB Neck ROM: Full    Dental no notable dental hx.    Pulmonary neg pulmonary ROS,    Pulmonary exam normal breath sounds clear to auscultation       Cardiovascular negative cardio ROS Normal cardiovascular exam Rhythm:Regular Rate:Normal     Neuro/Psych negative neurological ROS  negative psych ROS   GI/Hepatic negative GI ROS, Neg liver ROS,   Endo/Other  diabetes, Poorly Controlled, Type 2  Renal/GU negative Renal ROS  negative genitourinary   Musculoskeletal negative musculoskeletal ROS (+)   Abdominal   Peds negative pediatric ROS (+)  Hematology negative hematology ROS (+)   Anesthesia Other Findings   Reproductive/Obstetrics negative OB ROS                           Anesthesia Physical Anesthesia Plan  ASA: III  Anesthesia Plan: General   Post-op Pain Management:    Induction: Intravenous  Airway Management Planned: Oral ETT  Additional Equipment:   Intra-op Plan:   Post-operative Plan: Extubation in OR  Informed Consent: I have reviewed the patients History and Physical, chart, labs and discussed the procedure including the risks, benefits and alternatives for the proposed anesthesia with the patient or authorized representative who has indicated his/her understanding and acceptance.   Dental advisory given  Plan Discussed with: CRNA  Anesthesia Plan Comments: (Poorly controlled diabetes. Surgery is urgent. Discussed with Dr. Shelle Iron. Plan insulin sliding scale. Dr. Shelle Iron will consult hospitalist to attempt to improve diabetes management.)      Anesthesia Quick Evaluation

## 2015-07-07 NOTE — Progress Notes (Signed)
Inpatient Diabetes Program Recommendations  AACE/ADA: New Consensus Statement on Inpatient Glycemic Control (2015)  Target Ranges:  Prepandial:   less than 140 mg/dL      Peak postprandial:   less than 180 mg/dL (1-2 hours)      Critically ill patients:  140 - 180 mg/dL   Review of Glycemic Control  Diabetes history: DM2 Outpatient Diabetes medications: Had been prescribed metformin 500 bid, but has not gotten prescription filled Current orders for Inpatient glycemic control: Received Novolog 8 units for 1x dose  Inpatient Diabetes Program Recommendations:  Insulin - Basal: Consider addition of Lantus 10 units QHS Correction (SSI): Novolog moderate Q4H while NPO/CL diet, when diet advanced, tidwc and hs HgbA1C: Check HgbA1C to assess glycemic control prior to hospitalization Outpatient Referral: Will need assistance with obtaining PCP for diabetes management Diet: When advanced, CHO mod med   Will follow while inpatient.  Thank you. Ailene Ards, RD, LDN, CDE Inpatient Diabetes Coordinator [phone]

## 2015-07-07 NOTE — Interval H&P Note (Signed)
History and Physical Interval Note:  07/07/2015 10:48 AM  Trevor Medina  has presented today for surgery, with the diagnosis of HNP STENOSIS L5-S1  The various methods of treatment have been discussed with the patient and family. After consideration of risks, benefits and other options for treatment, the patient has consented to  Procedure(s): DECOMPRESSION L5-S1 BILATERALLY (Bilateral) as a surgical intervention .  The patient's history has been reviewed, patient examined, no change in status, stable for surgery.  I have reviewed the patient's chart and labs.  Questions were answered to the patient's satisfaction.     Manan Olmo C

## 2015-07-07 NOTE — Evaluation (Signed)
Physical Therapy Evaluation Patient Details Name: Trevor Medina MRN: [id number] DOB: 19-Feb-1971 Today's Date: 07/07/2015   History of Present Illness  44 yo male s/p bil decompression L5-S1 07/07/15. Hx of DM  Clinical Impression  On eval POD 0, pt required Min assist for mobility-walked ~75 feet with RW. Pain rated 10/10 with activity-back mostly but small amount down L LE. Instructed pt to sit no longer than 1 hour on today.     Follow Up Recommendations Home health PT    Equipment Recommendations  Rolling walker with 5" wheels    Recommendations for Other Services OT consult     Precautions / Restrictions Precautions Precautions: Back;Fall Precaution Comments: Educated on back precautions, logroll  Restrictions Weight Bearing Restrictions: No      Mobility  Bed Mobility Overal bed mobility: Needs Assistance Bed Mobility: Rolling;Sidelying to Sit Rolling: Min guard Sidelying to sit: Min assist       General bed mobility comments: assist for trunk to upright. Increased time. VCs safety, technique, hand placement  Transfers Overall transfer level: Needs assistance Equipment used: Rolling walker (2 wheeled) Transfers: Sit to/from Stand Sit to Stand: Min assist;From elevated surface         General transfer comment: Assist to rise, stabilize, control descent. VCs safety, technique, hand placement.   Ambulation/Gait Ambulation/Gait assistance: Min assist Ambulation Distance (Feet): 75 Feet Assistive device: Rolling walker (2 wheeled) Gait Pattern/deviations: Step-through pattern;Decreased stride length;Trunk flexed;Antalgic     General Gait Details: assist to stabilize and maneuver with rW. slow gait speed. VCs safety, posture, distance from RW.   Stairs            Wheelchair Mobility    Modified Rankin (Stroke Patients Only)       Balance                                             Pertinent Vitals/Pain Pain Assessment:  0-10 Pain Score: 10-Worst pain ever Pain Location: back. small amount of pain down side of L leg Pain Descriptors / Indicators: Radiating;Aching;Sore Pain Intervention(s): Monitored during session;Repositioned    Home Living Family/patient expects to be discharged to:: Private residence Living Arrangements: Spouse/significant other Available Help at Discharge: Family Type of Home: House Home Access: Stairs to enter Entrance Stairs-Rails: Right Entrance Stairs-Number of Steps: 5 Home Layout: Two level;Bed/bath upstairs Home Equipment: None      Prior Function Level of Independence: Independent               Hand Dominance        Extremity/Trunk Assessment   Upper Extremity Assessment: Defer to OT evaluation           Lower Extremity Assessment: Generalized weakness      Cervical / Trunk Assessment: Normal  Communication   Communication: No difficulties  Cognition Arousal/Alertness: Awake/alert Behavior During Therapy: WFL for tasks assessed/performed Overall Cognitive Status: Within Functional Limits for tasks assessed                      General Comments      Exercises        Assessment/Plan    PT Assessment Patient needs continued PT services  PT Diagnosis Difficulty walking;Acute pain   PT Problem List Decreased strength;Decreased activity tolerance;Decreased balance;Decreased mobility;Decreased range of motion;Pain;Decreased knowledge of use of DME;Decreased knowledge of precautions  PT Treatment  Interventions DME instruction;Gait training;Stair training;Functional mobility training;Therapeutic activities;Patient/family education;Balance training;Therapeutic exercise   PT Goals (Current goals can be found in the Care Plan section) Acute Rehab PT Goals Patient Stated Goal: less pain. regain PLOF PT Goal Formulation: With patient/family Time For Goal Achievement: 07/14/15 Potential to Achieve Goals: Good    Frequency Min 5X/week    Barriers to discharge        Co-evaluation               End of Session   Activity Tolerance: Patient limited by pain Patient left: in chair;with call bell/phone within reach;with family/visitor present      Functional Assessment Tool Used: clinical judgement Functional Limitation: Mobility: Walking and moving around Mobility: Walking and Moving Around Current Status (N8295): At least 20 percent but less than 40 percent impaired, limited or restricted Mobility: Walking and Moving Around Goal Status [phone]): At least 1 percent but less than 20 percent impaired, limited or restricted    Time: 8657-8469 PT Time Calculation (min) (ACUTE ONLY): 13 min   Charges:   PT Evaluation $Initial PT Evaluation Tier I: 1 Procedure     PT G Codes:   PT G-Codes **NOT FOR INPATIENT CLASS** Functional Assessment Tool Used: clinical judgement Functional Limitation: Mobility: Walking and moving around Mobility: Walking and Moving Around Current Status (G2952): At least 20 percent but less than 40 percent impaired, limited or restricted Mobility: Walking and Moving Around Goal Status [phone]): At least 1 percent but less than 20 percent impaired, limited or restricted    Rebeca Alert, MPT Pager: [phone]

## 2015-07-07 NOTE — Brief Op Note (Signed)
07/07/2015  12:28 PM  PATIENT:  Trevor Medina  44 y.o. male  PRE-OPERATIVE DIAGNOSIS:  HNP STENOSIS L5-S1  POST-OPERATIVE DIAGNOSIS:  HNP STENOSIS L5-S1  PROCEDURE:  Procedure(s): DECOMPRESSION L5-S1 BILATERALLY (Bilateral)  SURGEON:  Surgeon(s) and Role:    * Jene Every, MD - Primary  PHYSICIAN ASSISTANT:   ASSISTANTS: Bissell   ANESTHESIA:   general  EBL:  Total I/O In: 1000 [I.V.:1000] Out: -   BLOOD ADMINISTERED:none  DRAINS: none   LOCAL MEDICATIONS USED:  MARCAINE     SPECIMEN:  Source of Specimen:  L5S1  DISPOSITION OF SPECIMEN:  PATHOLOGY  COUNTS:  YES  TOURNIQUET:  * No tourniquets in log *  DICTATION: .Other Dictation: Dictation Number [id number]  PLAN OF CARE: Admit for overnight observation  PATIENT DISPOSITION:  PACU - hemodynamically stable.   Delay start of Pharmacological VTE agent (>24hrs) due to surgical blood loss or risk of bleeding: yes

## 2015-07-07 NOTE — Progress Notes (Signed)
Informed Dr. Council Mechanic of Mr. Escue [facility] of 272 with history of high glucose levels and no PCP currently. Pt currently not taking any diabetic medications.   No new orders given at this time.

## 2015-07-07 NOTE — H&P (View-Only) (Signed)
Trevor Medina is an 44 y.o. male.   Chief Complaint: back and leg pain HPI: The patient is a 44 year old male who presents with back pain. The patient is here today for a surgical consult and in referral from Dr. Ethelene Hal. The patient reports low back symptoms which began 8 month(s) ago following a specific injury. The injury occurred 07/11/2014 at work due to a fall (slipped on oil and fell landing on back and buttocks). Symptoms are reported to be located in the low back and Symptoms include pain. The pain radiates to the left thigh and right thigh. Prior to being seen today the patient was previously evaluated in the emergency room and by Dr. Ethelene Hal. Past evaluation has included x-ray of the lumbar spine and MRI of the lumbar spine. Past treatment has included opioid analgesics, muscle relaxants, corticosteroids and epidural injections. 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 Note for "Back pain": The patient currently has light duty work restrictions of no lifting over 5 lbs, no repetitive bending, stooping, squatting greater than 30 times per hour, no pushing or pulling and sedentary only.  Past Medical History  Diagnosis Date  . Chronic back pain     Past Surgical History  Procedure Laterality Date  . Ankle surgery      No family history on file. Social History:  reports that he has never smoked. He has never used smokeless tobacco. He reports that he drinks alcohol. He reports that he does not use illicit drugs.  Allergies: No Known Allergies   (Not in a hospital admission)  No results found for this or any previous visit (from the past 48 hour(s)). No results found.  Review of Systems  Constitutional: Negative.   HENT: Negative.   Eyes: Negative.   Respiratory: Negative.   Cardiovascular: Negative.   Gastrointestinal: Negative.   Genitourinary: Negative.   Musculoskeletal: Positive for back pain.  Skin: Negative.   Neurological: Positive for sensory  change and focal weakness.  Psychiatric/Behavioral: Negative.     There were no vitals taken for this visit. Physical Exam  Constitutional: He is oriented to person, place, and time. He appears well-developed.  HENT:  Head: Normocephalic.  Eyes: Pupils are equal, round, and reactive to light.  Neck: Normal range of motion.  Cardiovascular: Normal rate.   Respiratory: Effort normal.  GI: Soft.  Musculoskeletal:  He is in moderate distress. Mood and affect is appropriate. He has tender lumbosacral junction and the left and right proximal gluteus. His straight leg raise on the right produces marked buttock, thigh, calf pain, exacerbated with dorsal augmentation maneuvers. Trace EHL weakness on the right compared to the left. Diminished plantar flexion on the right, decreased sensation in the S1 dermatome.  Lumbar spine exam reveals no evidence of soft tissue swelling, deformity or skin ecchymosis. No flank pain with percussion. The abdomen is soft and nontender. Nontender over the trochanters. No cellulitis or lymphadenopathy.  Good range of motion of the lumbar spine without associated pain. Motor is 5/5 including tibialis anterior, quadriceps and hamstrings. Patient is normoreflexic. There is no Babinski or clonus. Patient has good distal pulses. No DVT. No pain and normal range of motion without instability of the hips, knees and ankles.  Inspection of the cervical spine reveals a normal lordosis without evidence of paraspinous spasms or soft tissue swelling. Nontender to palpation. Full flexion, full extension, full left and right lateral rotation. Extension combined with lateral flexion does not reproduce pain. Negative impingement  sign, negative secondary impingement sign of the shoulders. Negative Tinel's median and ulnar nerves at the elbow. Negative carpal compression test at the wrist. Motor of the upper extremities is 5/5 including biceps, triceps, brachioradialis, wrist flexion, wrist  extension, finger flexion, finger extension. Reflexes are normoreflexic. Sensory exam is intact to light touch. There is no Hoffmann sign. Nontender over the thoracic spine.  Neurological: He is alert and oriented to person, place, and time.    Three-view radiographs of the lumbar spine demonstrates moderate disc degeneration at L5-S1 with concomitant neural foraminal narrowing. No instability in flexion and extension. Hips are unremarkable.  MRI of the lumbar spine at outside facility demonstrates disc herniation at L5-S1 with moderately severe lateral recess stenosis at L5-S1 on the right, moderate on the left.  Assessment/Plan HNP/stenosis L5-S1  L5-S1 radiculopathy, secondary disc herniation, lateral recess stenosis at L5-S1. He does have increasing symptoms into the left lower extremity, most likely secondary to dynamic lateral recess stenosis as well. He does have some mechanical back pain and mild modic changes at L5-S1. We discussed options at this point. Living with his symptoms, he is eight months status post his injury with continued activity restriction and analgesics. He has had an injection with limited relief. One other option would be to consider a lumbar decompression at the L5-S1 on the right would suggest if he remains symptomatic on the left to consider decompression at L5-S1 on the left. We specifically stated that this would address his leg pain only and that continual modifications of his activity would be required to manages his disc degeneration long term, lifting restrictions activity modification, protective activities.  I had an extensive discussion of the risks and benefits of the lumbar decompression with the patient including bleeding, infection, damage to neurovascular structures, epidural fibrosis, CSF leak requiring repair. We also discussed increase in pain, adjacent segment disease, recurrent disc herniation, need for future surgery including repeat decompression  and/or fusion. We also discussed risks of postoperative hematoma, paralysis, anesthetic complications including DVT, PE, death, cardiopulmonary dysfunction. In addition, the perioperative and postoperative courses were discussed in detail including the rehabilitative time and return to functional activity and work. I provided the patient with an illustrated handout and utilized the appropriate surgical models.  We discussed possibly a fusion in the future. Again, 8 months status post, that is a reasonable option at this point. We discussed his particular injury. This is related to his Workmen's Compensation to his initial injury at work, which generated the disc herniation. We discussed this in extensive detail. Overnight in the hospital, two weeks of suture removal and physical therapy, at six weeks postop, light duty is currently sedentary and then at 10-12 weeks postop at maximum medical improvement and will consider at that point in time his return to his regular duty, would anticipate a return to a medium job classification. I appreciate the kind referral with Dr. Ethelene Hal. We will proceed accordingly. No history of MRSA or DVT.  Plan microlumbar decompression L5-S1 bilateral  BISSELL, JACLYN M. PA-C for Dr. Shelle Iron 06/22/2015, 9:09 AM

## 2015-07-07 NOTE — Anesthesia Procedure Notes (Signed)
Procedure Name: Intubation Date/Time: 07/07/2015 10:56 AM Performed by: Carolyne Fiscal, Kenley Troop F Pre-anesthesia Checklist: Patient identified, Emergency Drugs available, Suction available, Patient being monitored and Timeout performed Patient Re-evaluated:Patient Re-evaluated prior to inductionOxygen Delivery Method: Circle system utilized Preoxygenation: Pre-oxygenation with 100% oxygen Intubation Type: IV induction Laryngoscope Size: Mac and 3 Grade View: Grade I Tube type: Oral Tube size: 7.5 mm Number of attempts: 1 Airway Equipment and Method: Stylet Placement Confirmation: ETT inserted through vocal cords under direct vision,  positive ETCO2 and breath sounds checked- equal and bilateral Secured at: 24 cm Tube secured with: Tape Dental Injury: Teeth and Oropharynx as per pre-operative assessment

## 2015-07-07 NOTE — Anesthesia Postprocedure Evaluation (Signed)
  Anesthesia Post-op Note  Patient: Trevor Medina  Procedure(s) Performed: Procedure(s) (LRB): DECOMPRESSION L5-S1 BILATERALLY (Bilateral)  Patient Location: PACU  Anesthesia Type: General  Level of Consciousness: awake and alert   Airway and Oxygen Therapy: Patient Spontanous Breathing  Post-op Pain: mild  Post-op Assessment: Post-op Vital signs reviewed, Patient's Cardiovascular Status Stable, Respiratory Function Stable, Patent Airway and No signs of Nausea or vomiting. Has been given novolog 8 units SQ preop, then 6 units in PACU for glucose 227. Diabetes coordinator has been consulted by Dr. Shelle Iron.  Last Vitals:  Filed Vitals:   07/07/15 1445  BP: 141/84  Pulse: 74  Temp: 37.1 C  Resp: 18    Post-op Vital Signs: stable   Complications: No apparent anesthesia complications

## 2015-07-07 NOTE — Progress Notes (Signed)
Patient ID: Trevor Medina, male   DOB: 06-Jan-1971, 44 y.o.   MRN: [id number] Discussed compliance to diabetic meds with patient postop. Will proceed due to neurologic deficit. Arrange for medical F/U postop.

## 2015-07-07 NOTE — Op Note (Signed)
Trevor Medina, Trevor Medina              ACCOUNT NO.:  [id number]  MEDICAL RECORD NO.:  [id number]  LOCATION:  1621                         FACILITY:  [facility]  PHYSICIAN:  Jene Every, M.D.    DATE OF BIRTH:  [birth year]  DATE OF PROCEDURE:  07/07/2015 DATE OF DISCHARGE:                              OPERATIVE REPORT   PREOPERATIVE DIAGNOSES:  Spinal stenosis, herniated nucleus pulposus at L5-S1, bilaterally.  Elevated BMI of 31.  POSTOPERATIVE DIAGNOSES:  Spinal stenosis, herniated nucleus pulposus at L5-S1, bilaterally.  Elevated BMI of 31.  PROCEDURES PERFORMED: 1. Microlumbar decompression, L5-S1, bilaterally through the separate     fascial incision. 2. Microdiskectomy, L5-S1, left. 3. Foraminotomies, L5-S1, bilaterally.  ANESTHESIA:  General.  ASSISTANT:  Trevor Poche, PA.  HISTORY:  A 44 year old, bilateral lower extremity radicular pain, left greater than right.  EHL weakness, diminished plantar flexion, altered sensation of S1 dermatome on the left, also had radicular pain to the right.  He had a disk herniation.  Refractory conservative treatment was indicated for microlumbar decompression.  Risk and benefits were discussed including bleeding, infection, damage to neurovascular structures, DVT, PE, anesthetic complications, etc.  Preoperatively, the patient had an elevated blood glucose.  He had a diagnosis of non- insulin-dependent diabetes, apparently not taking his metformin.  I discussed with the Anesthesia perioperative glucose optimization, hospitalist consult and then a strict follow up to diminish risk of infection, etc.  The patient had persistent, refractory pain and neurologic deficit; therefore, proceeded with lumbar decompression at this point.  Technical difficulty increased due to the patient's elevated BMI.  TECHNIQUE:  With the patient in supine position, after induction of adequate general anesthesia, 2 g of Kefzol, placed prone on the  [place] frame.  All bony prominences were well padded.  Lumbar region was prepped and draped in usual sterile fashion.  Two 18-gauge spinal needles were utilized to localize the 4-5 and 5-1 interspace, confirmed with x-ray.  Incision was made from spinous process of 5-1. Subcutaneous tissue was dissected.  Electrocautery was utilized to achieve hemostasis.  Dorsolumbar fascia identified and divided on either line of the interspinous ligament.  Paraspinous muscle was elevated from lamina of 5-1.  McCullough retractor was placed.  Operating microscope was draped and brought on the surgical field.  Attention was turned towards the left, first as more symptomatic side.  Confirmatory radiograph obtained.  A curette was utilized and detached the ligamentum flavum from the cephalad edge of S1 and from the caudad edge of 5.  The neural elements were well protected, I performed a foraminotomy of S1. There was ligamentum flavum hypertrophy noted and compression of the S1 nerve root in the lateral recess secondary to ligamentum flavum hypertrophy and disk herniation.  I gently mobilized the S1 nerve root medially, decompressed the lateral recess in the medial border of the pedicle undercutting the superior articulating facet of S1, performed a foraminotomy of 5 as well as disk degeneration, degenerative changes and neural foraminal stenosis.  Focal HNP was noted.  I performed an annulotomy.  Copious portion of the disk material was removed from the disk space with a micro and a regular pituitary, further mobilized with an Epstein preserving the  endplates.  Irrigated the disk space with antibiotic irrigation, retrieved additional fragments with micropituitary and confirmatory radiograph obtained.  Good restoration of thecal sac and mobilization of the S1 nerve root 1 cm medial to pedicle without tension.  No evidence of CSF leakage or active bleeding. We draped the epidural fat over the S1 nerve root  and placed thrombin- soaked Gelfoam in the laminotomy defect.  We then turned attention towards the right.  In a similar fashion, placed a McCullough retractor, detached the ligamentum flavum from the cephalad edge of S1 and the caudad edge of 5, removing hypertrophic ligamentum from the interspace, protecting the nerve roots with neuro patties, performing a foraminotomy of S1, decompressed the lateral recess to the medial border of the pedicle.  Performed a foraminotomy of 5.  Disk was not felt to be ruptured here; therefore, an annulotomy was performed.  There was 1 cm of excursion of the S1 nerve root medial to pedicle without tension following this.  No evidence of CSF leakage or active bleeding. Copiously irrigated the wound.  We removed the retractors, paraspinous muscle was inspected and no evidence of active bleeding.  We repaired the dorsolumbar fascia with 1 Vicryl, subcu with 2-0, and skin with Prolene.  Sterile dressing applied.  Placed supine on the hospital bed, extubated without difficulty, and transported to the recovery room in satisfactory condition.  The patient tolerated the procedure well.  No complications.  Assistant, Trevor Medina, [place].  Minimal blood loss.  He was used for gentle intermittent neural traction, suturing and positioning.     Jene Every, M.D.     Trevor Medina  D:  07/07/2015  T:  07/07/2015  Job:  [id number]

## 2015-07-08 ENCOUNTER — Encounter (HOSPITAL_COMMUNITY): Payer: Self-pay | Admitting: Specialist

## 2015-07-08 DIAGNOSIS — E119 Type 2 diabetes mellitus without complications: Secondary | ICD-10-CM

## 2015-07-08 DIAGNOSIS — M4807 Spinal stenosis, lumbosacral region: Secondary | ICD-10-CM | POA: Diagnosis not present

## 2015-07-08 LAB — BASIC METABOLIC PANEL
ANION GAP: 9 (ref 5–15)
BUN: 9 mg/dL (ref 6–20)
CALCIUM: 9.1 mg/dL (ref 8.9–10.3)
CHLORIDE: 99 mmol/L — AB (ref 101–111)
CO2: 25 mmol/L (ref 22–32)
Creatinine, Ser: 0.76 mg/dL (ref 0.61–1.24)
GFR calc non Af Amer: >60 mL/min (ref >60)
Glucose, Bld: 231 mg/dL — ABNORMAL HIGH (ref 65–99)
Potassium: 3.8 mmol/L (ref 3.5–5.1)
SODIUM: 133 mmol/L — AB (ref 135–145)

## 2015-07-08 LAB — GLUCOSE, CAPILLARY
GLUCOSE-CAPILLARY: 199 mg/dL — AB (ref 65–99)
GLUCOSE-CAPILLARY: 250 mg/dL — AB (ref 65–99)
Glucose-Capillary: 221 mg/dL — ABNORMAL HIGH (ref 65–99)

## 2015-07-08 MED ORDER — LISINOPRIL 5 MG PO TABS
5.0000 mg | ORAL_TABLET | Freq: Every day | ORAL | Status: DC
  Administered 2015-07-08: 5 mg via ORAL
  Filled 2015-07-08: qty 1

## 2015-07-08 MED ORDER — INSULIN ASPART 100 UNIT/ML ~~LOC~~ SOLN: 6.0000 [IU] | Freq: Three times a day (TID) | SUBCUTANEOUS | Status: DC

## 2015-07-08 MED ORDER — FREESTYLE SYSTEM KIT: 1.0000 | PACK | Freq: Three times a day (TID) | Status: DC

## 2015-07-08 MED ORDER — GLIMEPIRIDE 2 MG PO TABS: 2.0000 mg | ORAL_TABLET | Freq: Every day | ORAL | Status: DC

## 2015-07-08 MED ORDER — MELOXICAM 7.5 MG PO TABS: 7.5000 mg | ORAL_TABLET | Freq: Every morning | ORAL | Status: DC

## 2015-07-08 MED ORDER — ACCU-CHEK MULTICLIX LANCETS MISC: Status: DC

## 2015-07-08 MED ORDER — GLIMEPIRIDE 2 MG PO TABS
2.0000 mg | ORAL_TABLET | Freq: Every day | ORAL | Status: DC
  Administered 2015-07-08: 2 mg via ORAL
  Filled 2015-07-08 (×2): qty 1

## 2015-07-08 MED ORDER — ALCOHOL PADS 70 % PADS: 1.0000 "application " | MEDICATED_PAD | Freq: Every day | Status: DC

## 2015-07-08 MED ORDER — INSULIN GLARGINE 100 UNIT/ML ~~LOC~~ SOLN: 20.0000 [IU] | Freq: Every day | SUBCUTANEOUS | Status: DC

## 2015-07-08 MED ORDER — METFORMIN HCL 500 MG PO TABS: 500.0000 mg | ORAL_TABLET | Freq: Two times a day (BID) | ORAL | Status: DC

## 2015-07-08 NOTE — Discharge Summary (Signed)
Physician Discharge Summary   Patient ID: Trevor Medina MRN: [id number] DOB/AGE: [birth year] 42 y.o.  Admit date: 07/07/2015 Discharge date: 07/08/2015  Primary Diagnosis:   HNP STENOSIS L5-S1  Admission Diagnoses:  Past Medical History  Diagnosis Date  . Chronic back pain   . HNP (herniated nucleus pulposus), lumbar   . Spinal stenosis   . History of gout   . Diabetes mellitus without complication ([place])     no meds - was given Rx for Metformin in March 2016 at urgent care ([facility]) but never filled Rx. Planned to treat with "diet and exercise"   Discharge Diagnoses:   Active Problems:   HNP (herniated nucleus pulposus), lumbar   Uncontrolled diabetes mellitus ([place])   Obesity  Procedure:  Procedure(s) (LRB): DECOMPRESSION L5-S1 BILATERALLY (Bilateral)   Consults: hospitalist  HPI:  see H&P    Laboratory Data: Hospital Outpatient Visit on 06/30/2015  Component Date Value Ref Range Status  . Sodium 06/30/2015 132* 135 - 145 mmol/L Final  . Potassium 06/30/2015 4.4  3.5 - 5.1 mmol/L Final  . Chloride 06/30/2015 99* 101 - 111 mmol/L Final  . CO2 06/30/2015 24  22 - 32 mmol/L Final  . Glucose, Bld 06/30/2015 280* 65 - 99 mg/dL Final  . BUN 06/30/2015 14  6 - 20 mg/dL Final  . Creatinine, Ser 06/30/2015 0.70  0.61 - 1.24 mg/dL Final  . Calcium 06/30/2015 9.5  8.9 - 10.3 mg/dL Final  . GFR calc non Af Amer 06/30/2015 >60  >60 mL/min Final  . GFR calc Af Amer 06/30/2015 >60  >60 mL/min Final   Comment: (NOTE) The eGFR has been calculated using the CKD EPI equation. This calculation has not been validated in all clinical situations. eGFR's persistently <60 mL/min signify possible Chronic Kidney Disease.   . Anion gap 06/30/2015 9  5 - 15 Final  . WBC 06/30/2015 5.7  4.0 - 10.5 K/uL Final  . RBC 06/30/2015 5.34  4.22 - 5.81 MIL/uL Final  . Hemoglobin 06/30/2015 14.0  13.0 - 17.0 g/dL Final  . HCT 06/30/2015 41.3  39.0 - 52.0 % Final  . MCV 06/30/2015 77.3* 78.0 -  100.0 fL Final  . MCH 06/30/2015 26.2  26.0 - 34.0 pg Final  . MCHC 06/30/2015 33.9  30.0 - 36.0 g/dL Final  . RDW 06/30/2015 14.1  11.5 - 15.5 % Final  . Platelets 06/30/2015 290  150 - 400 K/uL Final  . MRSA, PCR 06/30/2015 NEGATIVE  NEGATIVE Final  . Staphylococcus aureus 06/30/2015 NEGATIVE  NEGATIVE Final   Comment:        The Xpert SA Assay (FDA approved for NASAL specimens in patients over 57 years of age), is one component of a comprehensive surveillance program.  Test performance has been validated by [facility] for patients greater than or equal to 16 year old. It is not intended to diagnose infection nor to guide or monitor treatment.    No results for input(s): HGB in the last 72 hours. No results for input(s): WBC, RBC, HCT, PLT in the last 72 hours.  Recent Labs  07/07/15 0920 07/08/15 0505  NA  --  133*  K  --  3.8  CL  --  99*  CO2  --  25  BUN  --  9  CREATININE  --  0.76  GLUCOSE 303* 231*  CALCIUM  --  9.1   No results for input(s): LABPT, INR in the last 72 hours.  X-Rays:Dg Lumbar Spine 2-3  Views  06/30/2015   CLINICAL DATA:  Preoperative evaluation for lumbar decompression  EXAM: LUMBAR SPINE - 2-3 VIEW  COMPARISON:  07/12/2014  FINDINGS: Five lumbar type vertebral bodies are well visualized. Vertebral body height is well maintained. Disc space narrowing is again noted at L5-S1 with mild osteophytic changes. No spondylolisthesis is noted. A somewhat globular calcification is noted just to the left of the midline overlying the L4 transverse process on the left. This is stable from the prior exam and may be related to a phlebolith. No other soft tissue abnormality is seen.  IMPRESSION: Degenerative changes at L5-S1.  No acute abnormality noted.   Electronically Signed   By: Inez Catalina M.D.   On: 06/30/2015 12:38   Dg Spine Portable 1 View  07/07/2015   CLINICAL DATA:  L5-S1 decompression.  EXAM: PORTABLE SPINE - 1 VIEW  COMPARISON:  Same day.   FINDINGS: Single intraoperative cross-table lateral projection of lumbar spine demonstrates surgical probe directed toward posterior margin of L5-S1 disc space.  IMPRESSION: Surgical localization as described above.   Electronically Signed   By: Marijo Conception, M.D.   On: 07/07/2015 12:17   Dg Spine Portable 1 View  07/07/2015   CLINICAL DATA:  L5-S1 decompression.  EXAM: PORTABLE SPINE - 1 VIEW  COMPARISON:  Same day.  FINDINGS: Single intraoperative cross-table lateral projection of the lumbar spine demonstrates surgical probe directed toward posterior margin of L5-S1 disc space.  IMPRESSION: Surgical localization as described above.   Electronically Signed   By: Marijo Conception, M.D.   On: 07/07/2015 11:47   Dg Spine Portable 1 View  07/07/2015   CLINICAL DATA:  L5-S1 lumbar decompression.  EXAM: PORTABLE SPINE - 1 VIEW  COMPARISON:  June 30, 2015.  FINDINGS: Single intraoperative cross-table lateral projection of the lumbar spine was obtained. Surgical probes are seen directed toward the posterior spinous processes of L4 and L5.  IMPRESSION: Surgical localization as described above.   Electronically Signed   By: Marijo Conception, M.D.   On: 07/07/2015 11:45    EKG: Orders placed or performed in visit on 06/30/15  . EKG 12-Lead     Hospital Course: Patient was admitted to [facility] and taken to the OR and underwent the above state procedure without complications.  Patient tolerated the procedure well and was later transferred to the recovery room and then to the orthopaedic floor for postoperative care.  They were given PO and IV analgesics for pain control following their surgery.  They were given 24 hours of postoperative antibiotics.   PT was consulted postop to assist with mobility and transfers.  The patient was allowed to be WBAT with therapy and was taught back precautions. Discharge planning was consulted to help with postop disposition and equipment needs.  Patient had a  good night on the evening of surgery and started to get up OOB with therapy on day one. Patient was seen in rounds and was ready to go home on day one. Hospitalist service was consulted for tx of DM. They were given discharge instructions and dressing directions.  They were instructed on when to follow up in the office with Dr. Tonita Cong.   Diet: Diabetic diet Activity:WBAT; LSpine precautions Follow-up:in 10-14 days Disposition - Home Discharged Condition: good   Discharge Instructions    Call MD / Call 911    Complete by:  As directed   If you experience chest pain or shortness of breath, CALL 911 and  be transported to the hospital emergency room.  If you develope a fever above 101 F, pus (white drainage) or increased drainage or redness at the wound, or calf pain, call your surgeon's office.     Constipation Prevention    Complete by:  As directed   Drink plenty of fluids.  Prune juice may be helpful.  You may use a stool softener, such as Colace (over the counter) 100 mg twice a day.  Use MiraLax (over the counter) for constipation as needed.     Diet - low sodium heart healthy    Complete by:  As directed      Increase activity slowly as tolerated    Complete by:  As directed             Medication List    STOP taking these medications        benzonatate 100 MG capsule  Commonly known as:  TESSALON     dextromethorphan-guaiFENesin 30-600 MG 12hr tablet  Commonly known as:  MUCINEX DM     HYDROcodone-acetaminophen 10-325 MG tablet  Commonly known as:  NORCO     LIDOPRO EX     naproxen 375 MG tablet  Commonly known as:  NAPROSYN     naproxen 500 MG tablet  Commonly known as:  NAPROSYN      TAKE these medications        accu-chek multiclix lancets  daily     albuterol 108 (90 BASE) MCG/ACT inhaler  Commonly known as:  PROVENTIL HFA;VENTOLIN HFA  Inhale 1-2 puffs into the lungs every 6 (six) hours as needed for wheezing or shortness of breath.     Alcohol Pads 70  % Pads  1 application by Does not apply route daily.     docusate sodium 100 MG capsule  Commonly known as:  COLACE  Take 1 capsule (100 mg total) by mouth 2 (two) times daily as needed for mild constipation.     glimepiride 2 MG tablet  Commonly known as:  AMARYL  Take 1 tablet (2 mg total) by mouth daily with breakfast.     glucose monitoring kit monitoring kit  1 each by Does not apply route 4 (four) times daily - after meals and at bedtime. 1 month Diabetic Testing Supplies for QAC-QHS accuchecks.     meloxicam 7.5 MG tablet  Commonly known as:  MOBIC  Take 1 tablet (7.5 mg total) by mouth every morning. May resume 5 days post-op     metFORMIN 500 MG tablet  Commonly known as:  GLUCOPHAGE  Take 1 tablet (500 mg total) by mouth 2 (two) times daily with a meal.     methocarbamol 500 MG tablet  Commonly known as:  ROBAXIN  Take 1 tablet (500 mg total) by mouth 3 (three) times daily.     oxyCODONE-acetaminophen 5-325 MG tablet  Commonly known as:  PERCOCET  Take 1 tablet by mouth every 4 (four) hours as needed.           Follow-up Information    Follow up with BEANE,JEFFREY C, MD In 2 weeks.   Specialty:  Orthopedic Surgery   Why:  For suture removal   Contact information:   [address] [place] [address] [phone]       Follow up with [facility].   Why:  Call this number, follow the prompts to get  a Primary Care Physician AND make an appointment.   Contact information:   [phone]  Follow up with [employer] [facility].   Why:  Roylene Reason is your nurse case manager working with your adjuster Anderson Malta at [employer] and will be arranging hme health physical therapy   Contact information:   [phone] Your case number is  [id number]      Signed: Lacie Draft, PA-C Orthopaedic Surgery 07/08/2015, 11:44 AM

## 2015-07-08 NOTE — Discharge Instructions (Signed)
Walk As Tolerated utilizing back precautions.  No bending, twisting, or lifting.  No driving for 2 weeks.   Aquacel dressing may remain in place until follow up. May shower with aquacel dressing in place. If the dressing peels off or becomes saturated, you may remove aquacel dressing and place gauze and tape dressing which should be kept clean and dry and changed daily. Do not remove steri-strips if they are present. See Dr. Shelle Iron in office in 10 to 14 days. Begin taking aspirin 81mg  per day starting 4 days after your surgery if not allergic to aspirin or on another blood thinner. Walk daily even outside. Use a cane or walker only if necessary. Avoid sitting on soft sofas.   Diabetes Mellitus and Food It is important for you to manage your blood sugar (glucose) level. Your blood glucose level can be greatly affected by what you eat. Eating healthier foods in the appropriate amounts throughout the day at about the same time each day will help you control your blood glucose level. It can also help slow or prevent worsening of your diabetes mellitus. Healthy eating may even help you improve the level of your blood pressure and reach or maintain a healthy weight.  General recommendations for healthful eating and cooking habits include:  Eating meals and snacks regularly. Avoid going long periods of time without eating to lose weight.  Eating a diet that consists mainly of plant-based foods, such as fruits, vegetables, nuts, legumes, and whole grains.  Using low-heat cooking methods, such as baking, instead of high-heat cooking methods, such as deep frying. Work with your dietitian to make sure you understand how to use the Nutrition Facts information on food labels. HOW CAN FOOD AFFECT ME? Carbohydrates Carbohydrates affect your blood glucose level more than any other type of food. Your dietitian will help you determine how many carbohydrates to eat at each meal and teach you how to count  carbohydrates. Counting carbohydrates is important to keep your blood glucose at a healthy level, especially if you are using insulin or taking certain medicines for diabetes mellitus. Alcohol Alcohol can cause sudden decreases in blood glucose (hypoglycemia), especially if you use insulin or take certain medicines for diabetes mellitus. Hypoglycemia can be a life-threatening condition. Symptoms of hypoglycemia (sleepiness, dizziness, and disorientation) are similar to symptoms of having too much alcohol.  If your health care provider has given you approval to drink alcohol, do so in moderation and use the following guidelines:  Women should not have more than one drink per day, and men should not have more than two drinks per day. One drink is equal to:  12 oz of beer.  5 oz of wine.  1 oz of hard liquor.  Do not drink on an empty stomach.  Keep yourself hydrated. Have water, diet soda, or unsweetened iced tea.  Regular soda, juice, and other mixers might contain a lot of carbohydrates and should be counted. WHAT FOODS ARE NOT RECOMMENDED? As you make food choices, it is important to remember that all foods are not the same. Some foods have fewer nutrients per serving than other foods, even though they might have the same number of calories or carbohydrates. It is difficult to get your body what it needs when you eat foods with fewer nutrients. Examples of foods that you should avoid that are high in calories and carbohydrates but low in nutrients include:  Trans fats (most processed foods list trans fats on the Nutrition Facts label).  Regular  soda.  Juice.  Candy.  Sweets, such as cake, pie, doughnuts, and cookies.  Fried foods. WHAT FOODS CAN I EAT? Eat nutrient-rich foods, which will nourish your body and keep you healthy. The food you should eat also will depend on several factors, including:  The calories you need.  The medicines you take.  Your weight.  Your blood  glucose level.  Your blood pressure level.  Your cholesterol level. You should eat a variety of foods, including:  Protein.  Lean cuts of meat.  Proteins low in saturated fats, such as fish, egg whites, and beans. Avoid processed meats.  Fruits and vegetables.  Fruits and vegetables that may help control blood glucose levels, such as apples, mangoes, and yams.  Dairy products.  Choose fat-free or low-fat dairy products, such as milk, yogurt, and cheese.  Grains, bread, pasta, and rice.  Choose whole grain products, such as multigrain bread, whole oats, and brown rice. These foods may help control blood pressure.  Fats.  Foods containing healthful fats, such as nuts, avocado, olive oil, canola oil, and fish. DOES EVERYONE WITH DIABETES MELLITUS HAVE THE SAME MEAL PLAN? Because every person with diabetes mellitus is different, there is not one meal plan that works for everyone. It is very important that you meet with a dietitian who will help you create a meal plan that is just right for you.   This information is not intended to replace advice given to you by your health care provider. Make sure you discuss any questions you have with your health care provider.   Document Released: 06/15/2005 Document Revised: 10/09/2014 Document Reviewed: 08/15/2013 Elsevier Interactive Patient Education 2016 [employer]  Blood Glucose Monitoring, Adult Monitoring your blood glucose (also know as blood sugar) helps you to manage your diabetes. It also helps you and your health care provider monitor your diabetes and determine how well your treatment plan is working. WHY SHOULD YOU MONITOR YOUR BLOOD GLUCOSE?  It can help you understand how food, exercise, and medicine affect your blood glucose.  It allows you to know what your blood glucose is at any given moment. You can quickly tell if you are having low blood glucose (hypoglycemia) or high blood glucose (hyperglycemia).  It can help  you and your health care provider know how to adjust your medicines.  It can help you understand how to manage an illness or adjust medicine for exercise. WHEN SHOULD YOU TEST? Your health care provider will help you decide how often you should check your blood glucose. This may depend on the type of diabetes you have, your diabetes control, or the types of medicines you are taking. Be sure to write down all of your blood glucose readings so that this information can be reviewed with your health care provider. See below for examples of testing times that your health care provider may suggest. Type 1 Diabetes  Test at least 2 times per day if your diabetes is well controlled, if you are using an insulin pump, or if you perform multiple daily injections.  If your diabetes is not well controlled or if you are sick, you may need to test more often.  It is a good idea to also test:  Before every insulin injection.  Before and after exercise.  Between meals and 2 hours after a meal.  Occasionally between 2:00 a.m. and 3:00 a.m. Type 2 Diabetes  If you are taking insulin, test at least 2 times per day. However, it is best  to test before every insulin injection.  If you take medicines by mouth (orally), test 2 times a day.  If you are on a controlled diet, test once a day.  If your diabetes is not well controlled or if you are sick, you may need to monitor more often. HOW TO MONITOR YOUR BLOOD GLUCOSE Supplies Needed  Blood glucose meter.  Test strips for your meter. Each meter has its own strips. You must use the strips that go with your own meter.  A pricking needle (lancet).  A device that holds the lancet (lancing device).  A journal or log book to write down your results. Procedure  Wash your hands with soap and water. Alcohol is not preferred.  Prick the side of your finger (not the tip) with the lancet.  Gently milk the finger until a small drop of blood  appears.  Follow the instructions that come with your meter for inserting the test strip, applying blood to the strip, and using your blood glucose meter. Other Areas to Get Blood for Testing Some meters allow you to use other areas of your body (other than your finger) to test your blood. These areas are called alternative sites. The most common alternative sites are:  The forearm.  The thigh.  The back area of the lower leg.  The palm of the hand. The blood flow in these areas is slower. Therefore, the blood glucose values you get may be delayed, and the numbers are different from what you would get from your fingers. Do not use alternative sites if you think you are having hypoglycemia. Your reading will not be accurate. Always use a finger if you are having hypoglycemia. Also, if you cannot feel your lows (hypoglycemia unawareness), always use your fingers for your blood glucose checks. ADDITIONAL TIPS FOR GLUCOSE MONITORING  Do not reuse lancets.  Always carry your supplies with you.  All blood glucose meters have a 24-hour "hotline" number to call if you have questions or need help.  Adjust (calibrate) your blood glucose meter with a control solution after finishing a few boxes of strips. BLOOD GLUCOSE RECORD KEEPING It is a good idea to keep a daily record or log of your blood glucose readings. Most glucose meters, if not all, keep your glucose records stored in the meter. Some meters come with the ability to download your records to your home computer. Keeping a record of your blood glucose readings is especially helpful if you are wanting to look for patterns. Make notes to go along with the blood glucose readings because you might forget what happened at that exact time. Keeping good records helps you and your health care provider to work together to achieve good diabetes management.    This information is not intended to replace advice given to you by your health care provider.  Make sure you discuss any questions you have with your health care provider.   Document Released: 09/21/2003 Document Revised: 10/09/2014 Document Reviewed: 02/10/2013 Elsevier Interactive Patient Education [employer]

## 2015-07-08 NOTE — Progress Notes (Signed)
Utilization review completed.  

## 2015-07-08 NOTE — Evaluation (Signed)
Occupational Therapy Evaluation Patient Details Name: Trevor Medina MRN: [id number] DOB: April 05, 1971 Today's Date: 07/08/2015    History of Present Illness 44 yo male s/p bil decompression L5-S1 07/07/15. Hx of DM   Clinical Impression   Pt limited by 9/10 pain with initial functional tasks in room today and decreased to 7/10 pain as session progressed. Wife present for part of session. Educated on all back precautions and issued back care handout. Would benefit from additional OT practice if here after today. Wife states she can help pt with LB dressing and pt states he may consider AE kit. Will follow.    Follow Up Recommendations  No OT follow up;Supervision/Assistance - 24 hour    Equipment Recommendations  3 in 1 bedside comode    Recommendations for Other Services       Precautions / Restrictions Precautions Precautions: Back;Fall Precaution Comments: Educated on back precautions. Issued back care handout and reviewed all information Restrictions Weight Bearing Restrictions: No      Mobility Bed Mobility Overal bed mobility: Needs Assistance Bed Mobility: Rolling;Sidelying to Sit Rolling: Min guard Sidelying to sit: Min assist       General bed mobility comments: increased time, verbal cues for log roll technique. did not use rail-cues for hand placement.  Transfers Overall transfer level: Needs assistance Equipment used: Rolling walker (2 wheeled) Transfers: Sit to/from Stand Sit to Stand: Min assist         General transfer comment: increased time, cues to not twist/adhere to precautions, cues hand placement.    Balance                                            ADL Overall ADL's : Needs assistance/impaired Eating/Feeding: Independent;Sitting   Grooming: Wash/dry hands;Set up;Sitting   Upper Body Bathing: Set up;Supervision/ safety;Sitting   Lower Body Bathing: Moderate assistance;Sit to/from stand   Upper Body Dressing :  Supervision/safety;Set up;Sitting   Lower Body Dressing: Moderate assistance;Sit to/from stand   Toilet Transfer: Minimal assistance;Ambulation;BSC;RW   Toileting- Clothing Manipulation and Hygiene: Minimal assistance;Sit to/from stand         General ADL Comments: Pt's wife present for part of session. Educated both on back precautions and issued back care handout and information. Pt most limited by pain with transitions from sidelying to sit and sit to stand. Pt states his bed is really high at home so practiced with high bed surface and a stool and advised pt to get a sturdy stool for home. He asked about a tub/plastic storage box but explained pt needs a steady, harder surface such as a wooden stool. Educated on AE options for LB self care and explained coverage. pt states he may have family come back to hospital to get AE kit. Demonstrated all AE items to pt and how to use. Advised to wait a few days on showering at least as pt is still weak in [place] and having signfificant pain with transitions and explained sponge bathing would be safer initially. did demonstate best technique for stepping in and out of shower and use of 3in1 as shower chair also. Pt has some difficulty with sit to stand and stand to sit and needs frequent verbal cues. Verbal cues for keeping back straight to perform toilet hygiene and periarea bathing.     Vision     Perception     Praxis  Pertinent Vitals/Pain Pain Assessment: 0-10 Pain Score: 9  Pain Location: back. 7/10 at end of session Pain Descriptors / Indicators: Aching;Radiating Pain Intervention(s): Repositioned;Monitored during session     Hand Dominance     Extremity/Trunk Assessment Upper Extremity Assessment Upper Extremity Assessment: Overall WFL for tasks assessed           Communication     Cognition Arousal/Alertness: Awake/alert Behavior During Therapy: Anxious Overall Cognitive Status: Within Functional Limits for tasks  assessed                     General Comments       Exercises       Shoulder Instructions      Home Living                                          Prior Functioning/Environment               OT Diagnosis: Generalized weakness;Acute pain   OT Problem List: Decreased strength;Decreased knowledge of use of DME or AE;Decreased knowledge of precautions;Pain   OT Treatment/Interventions: Self-care/ADL training;Patient/family education;Therapeutic activities;DME and/or AE instruction    OT Goals(Current goals can be found in the care plan section) Acute Rehab OT Goals Patient Stated Goal: less pain. walk better and do his daily activities easier. OT Goal Formulation: With patient Time For Goal Achievement: 07/15/15 Potential to Achieve Goals: Good  OT Frequency: Min 2X/week   Barriers to D/C:            Co-evaluation              End of Session Equipment Utilized During Treatment: Rolling walker  Activity Tolerance: Patient limited by pain Patient left: in chair;with call bell/phone within reach   Time: 0914-1013 OT Time Calculation (min): 59 min Charges:  OT Evaluation $Initial OT Evaluation Tier I: 1 Procedure OT Treatments $Self Care/Home Management : 23-37 mins $Therapeutic Activity: 8-22 mins G-Codes: OT G-codes **NOT FOR INPATIENT CLASS** Functional Assessment Tool Used: clinical judgement Functional Limitation: Self care Self Care Current Status (I7867): At least 20 percent but less than 40 percent impaired, limited or restricted Self Care Goal Status (E7209): At least 1 percent but less than 20 percent impaired, limited or restricted  [place], [place] 07/08/2015, 10:47 AM

## 2015-07-08 NOTE — Progress Notes (Signed)
Subjective: 1 Day Post-Op Procedure(s) (LRB): DECOMPRESSION L5-S1 BILATERALLY (Bilateral) Patient reports pain as mild.  Leg pain improved. Hoping to go home today.  Objective: Vital signs in last 24 hours: Temp:  [98 F (36.7 C)-98.8 F (37.1 C)] 98.6 F (37 C) (10/06 0953) Pulse Rate:  [74-93] 90 (10/06 0953) Resp:  [11-31] 18 (10/06 0953) BP: (112-167)/(60-92) 139/81 mmHg (10/06 0953) SpO2:  [98 %-100 %] 99 % (10/06 0953) Weight:  [98.884 kg (218 lb)] 98.884 kg (218 lb) (10/05 1445)  Intake/Output from previous day: 10/05 0701 - 10/06 0700 In: 3807.3 [P.O.:1440; I.V.:2267.3; IV Piggyback:100] Out: 1900 [Urine:1900] Intake/Output this shift: Total I/O In: 240 [P.O.:240] Out: 400 [Urine:400]  No results for input(s): HGB in the last 72 hours. No results for input(s): WBC, RBC, HCT, PLT in the last 72 hours.  Recent Labs  07/07/15 0920 07/08/15 0505  NA  --  133*  K  --  3.8  CL  --  99*  CO2  --  25  BUN  --  9  CREATININE  --  0.76  GLUCOSE 303* 231*  CALCIUM  --  9.1   No results for input(s): LABPT, INR in the last 72 hours.  Neurologically intact ABD soft Neurovascular intact Sensation intact distally Intact pulses distally Dorsiflexion/Plantar flexion intact Incision: dressing C/D/I and no drainage No cellulitis present Compartment soft no sign of DVT  Assessment/Plan: 1 Day Post-Op Procedure(s) (LRB): DECOMPRESSION L5-S1 BILATERALLY (Bilateral) Advance diet Up with therapy D/C IV fluids  Discussed D/C instructions, dressing instructions, Lspine precautions Will await hospitalist recommendations regarding D/C and his DM, will need to ensure follow up Discussed the importance of glycemic control Plan possible D/C later today if ok with hospitalists Seen by myself and Dr. Elissa Lovett, Dayna Barker. 07/08/2015, 11:34 AM
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
## 2015-07-08 NOTE — Plan of Care (Signed)
Problem: Consults Goal: Diagnosis - Spinal Surgery Outcome: Completed/Met Date Met:  07/08/15 Microdiscectomy L5-S1

## 2015-07-08 NOTE — Progress Notes (Signed)
Physical Therapy Treatment Patient Details Name: Trevor Medina MRN: [id number] DOB: 1971/04/02 Today's Date: 07/08/2015    History of Present Illness 44 yo male s/p bil decompression L5-S1 07/07/15. Hx of DM    PT Comments    Progressing slowly with mobility. Pt continues to report moderate pain in back. Practiced bed mobility, ambulation and stair negotiation. Reviewed back precautions and car transfer. Wife present during session. All education completed.   Follow Up Recommendations  Home health PT     Equipment Recommendations  Rolling walker with 5" wheels (nursing gave pt a walker he can borrow until he is able to get his DME)    Recommendations for Other Services       Precautions / Restrictions Precautions Precautions: Fall;Back Precaution Comments: Educated on back precautions, logroll Restrictions Weight Bearing Restrictions: No    Mobility  Bed Mobility Overal bed mobility: Needs Assistance Bed Mobility: Sit to Sidelying;Rolling Rolling: Supervision Sidelying to sit: Min assist       General bed mobility comments: increased time, verbal cues for log roll technique. did not use rail-cues for hand placement. small amount of assist for LEs onto bed. Practiced getting onto bed using step since pt states bed is very high. Encouraged wife to either get help removing boxspring and dropping mattress down OR getting step (similar to practice step-not a step ladder or stool) to accomodate for height of bed.  Transfers Overall transfer level: Needs assistance Equipment used: Rolling walker (2 wheeled) Transfers: Sit to/from Stand Sit to Stand: Min guard         General transfer comment: close guard for safety VCS safety, technique, hand placement, adherence to precautions. Increased time.  Ambulation/Gait Ambulation/Gait assistance: Min guard Ambulation Distance (Feet): 65 Feet Assistive device: Rolling walker (2 wheeled) Gait Pattern/deviations: Step-through  pattern;Decreased stride length     General Gait Details: close guard for safety. slow gait speed. VCs safety, posture, distance from RW.    Stairs Stairs: Yes Stairs assistance: Min guard Stair Management: One rail Left;Step to pattern;Forwards;One rail Right Number of Stairs: 4 General stair comments: VCs safety, technique, sequence. Increased time. close guard for safety. Instructed pt to have wife with him anytime he is going up/down stairs.   Wheelchair Mobility    Modified Rankin (Stroke Patients Only)       Balance                                    Cognition Arousal/Alertness: Awake/alert Behavior During Therapy: WFL for tasks assessed/performed Overall Cognitive Status: Within Functional Limits for tasks assessed                      Exercises      General Comments        Pertinent Vitals/Pain Pain Assessment: Faces Pain Score: 9  Faces Pain Scale: Hurts whole lot Pain Location: back with activity Pain Descriptors / Indicators: Sore;Sharp Pain Intervention(s): Monitored during session;Repositioned    Home Living                      Prior Function            PT Goals (current goals can now be found in the care plan section) Acute Rehab PT Goals Patient Stated Goal: less pain. walk better and do his daily activities easier. Progress towards PT goals: Progressing toward goals    Frequency  Min 5X/week    PT Plan Current plan remains appropriate    Co-evaluation             End of Session   Activity Tolerance: Patient limited by pain Patient left: in bed;with call bell/phone within reach;with family/visitor present     Time: 9604-5409 PT Time Calculation (min) (ACUTE ONLY): 35 min  Charges:  $Gait Training: 8-22 mins                    G Codes:  Functional Assessment Tool Used: clinical judgement Functional Limitation: Mobility: Walking and moving around Mobility: Walking and Moving Around Goal  Status [phone]): At least 1 percent but less than 20 percent impaired, limited or restricted Mobility: Walking and Moving Around Discharge Status [phone]): At least 1 percent but less than 20 percent impaired, limited or restricted   Rebeca Alert, MPT Pager: [phone]

## 2015-07-08 NOTE — Progress Notes (Addendum)
[facility] PROGRESS NOTE  Trevor Medina XBJ:[id number] DOB: [birth year] DOA: 07/07/2015 PCP: No PCP Per Patient  Assessment/Plan: Uncontrolled diabetes mellitus Patient was diagnosed about 6 months back randomly and prescribed metformin in the ED but never filled. Has dietary non adherence. -A1c pending. Placed on  Lantus 15 units at bedtime along with pre-meal aspart  3 times a day with meals along with sliding scale monitoring.fsg in the range of 170-220. -Counseled extensively on dietary adherence, last modification to reduce weight and possible need for oral hypoglycemic along with insulin. Discussed again this am. Patient is not interested in starting insulin at this time and wants to lose weight and adhere to strict diet regimen to control his blood glucose. -A1C is pending and i strongly suspect it will be high. -recommend to discharge on metformin 500 mg bid and alaryl 2 mg daily. Needs to establish outpt care. CM consulted to help establishing [facility] at [facility]. -Will provide him with educational resources on meal planning and blood glucose monitoring. -Patient will need a glucometer and other diabetic supplies upon discharge.  ( prescription written) -UA shows some proteinuria. Should benefit from adding an ACEi . This can be done as outpt once care is established.    obesity Counseled on weight loss and exercise  Herniated nucleus pulposus Status post L5-S1 decompression surgery. Pain control and further plan per primary team   Family Communication: wife at bedside Disposition Plan:home today per primary    HPI/Subjective: No overnight sisues. fsg 180-220  Objective: Filed Vitals:   07/08/15 0953  BP: 139/81  Pulse: 90  Temp: 98.6 F (37 C)  Resp: 18    Intake/Output Summary (Last 24 hours) at 07/08/15 1132 Last data filed at 07/08/15 1100  Gross per 24 hour  Intake 4047.33 ml  Output   2300 ml  Net 1747.33 ml   Filed Weights   07/07/15  1445  Weight: 98.884 kg (218 lb)    Exam:  General: no acute distress HEENT:  moist oral mucosa,  Chest: Clear to auscultation bilaterally,  CVS: Normal S1 and S2 GI: Soft, nondistended, nontender, bowel sounds present Musculoskeletal: Warm,, dressing over lower lumbar surgical area clean  Data Reviewed: Basic Metabolic Panel:  Recent Labs Lab 07/07/15 0920 07/08/15 0505  NA  --  133*  K  --  3.8  CL  --  99*  CO2  --  25  GLUCOSE 303* 231*  BUN  --  9  CREATININE  --  0.76  CALCIUM  --  9.1   Liver Function Tests: No results for input(s): AST, ALT, ALKPHOS, BILITOT, PROT, ALBUMIN in the last 168 hours. No results for input(s): LIPASE, AMYLASE in the last 168 hours. No results for input(s): AMMONIA in the last 168 hours. CBC: No results for input(s): WBC, NEUTROABS, HGB, HCT, MCV, PLT in the last 168 hours. Cardiac Enzymes: No results for input(s): CKTOTAL, CKMB, CKMBINDEX, TROPONINI in the last 168 hours. BNP (last 3 results) No results for input(s): BNP in the last 8760 hours.  ProBNP (last 3 results) No results for input(s): PROBNP in the last 8760 hours.  CBG:  Recent Labs Lab 07/07/15 1223 07/07/15 1250 07/07/15 1652 07/07/15 2113 07/08/15 0726  GLUCAP 199* 227* 172* 197* 221*    Recent Results (from the past 240 hour(s))  Surgical pcr screen     Status: None   Collection Time: 06/30/15 11:25 AM  Result Value Ref Range Status   MRSA, PCR NEGATIVE NEGATIVE Final  Staphylococcus aureus NEGATIVE NEGATIVE Final    Comment:        The Xpert SA Assay (FDA approved for NASAL specimens in patients over 42 years of age), is one component of a comprehensive surveillance program.  Test performance has been validated by [facility] for patients greater than or equal to 42 year old. It is not intended to diagnose infection nor to guide or monitor treatment.      Studies: Dg Spine Portable 1 View  07/07/2015   CLINICAL DATA:  L5-S1 decompression.   EXAM: PORTABLE SPINE - 1 VIEW  COMPARISON:  Same day.  FINDINGS: Single intraoperative cross-table lateral projection of lumbar spine demonstrates surgical probe directed toward posterior margin of L5-S1 disc space.  IMPRESSION: Surgical localization as described above.   Electronically Signed   By: Lupita Raider, M.D.   On: 07/07/2015 12:17   Dg Spine Portable 1 View  07/07/2015   CLINICAL DATA:  L5-S1 decompression.  EXAM: PORTABLE SPINE - 1 VIEW  COMPARISON:  Same day.  FINDINGS: Single intraoperative cross-table lateral projection of the lumbar spine demonstrates surgical probe directed toward posterior margin of L5-S1 disc space.  IMPRESSION: Surgical localization as described above.   Electronically Signed   By: Lupita Raider, M.D.   On: 07/07/2015 11:47   Dg Spine Portable 1 View  07/07/2015   CLINICAL DATA:  L5-S1 lumbar decompression.  EXAM: PORTABLE SPINE - 1 VIEW  COMPARISON:  June 30, 2015.  FINDINGS: Single intraoperative cross-table lateral projection of the lumbar spine was obtained. Surgical probes are seen directed toward the posterior spinous processes of L4 and L5.  IMPRESSION: Surgical localization as described above.   Electronically Signed   By: Lupita Raider, M.D.   On: 07/07/2015 11:45    Scheduled Meds: . acidophilus  1 capsule Oral Daily  . glimepiride  2 mg Oral Q breakfast  . insulin aspart  0-15 Units Subcutaneous TID WC  . insulin aspart  6 Units Subcutaneous TID WC  . insulin glargine  20 Units Subcutaneous QHS  . lisinopril  5 mg Oral Daily   Continuous Infusions: . 0.45 % NaCl with KCl 20 mEq / L 10 mL/hr at 07/08/15 0150      Time spent: 20 minutes    Deverick Pruss  [facility] Pager [phone] If 7PM-7AM, please contact night-coverage at [email or web address], password [facility] 07/08/2015, 11:32 AM

## 2015-07-09 LAB — HEMOGLOBIN A1C
Hgb A1c MFr Bld: 11.6 % — ABNORMAL HIGH (ref 4.8–5.6)
MEAN PLASMA GLUCOSE: 286 mg/dL

## 2015-09-13 ENCOUNTER — Ambulatory Visit (INDEPENDENT_AMBULATORY_CARE_PROVIDER_SITE_OTHER): Payer: PRIVATE HEALTH INSURANCE | Admitting: Physician Assistant

## 2015-09-13 VITALS — BP 144/96 | HR 86 | Temp 98.6°F | Resp 18 | Ht 69.0 in | Wt 220.0 lb

## 2015-09-13 DIAGNOSIS — N489 Disorder of penis, unspecified: Secondary | ICD-10-CM | POA: Diagnosis not present

## 2015-09-13 DIAGNOSIS — E1165 Type 2 diabetes mellitus with hyperglycemia: Secondary | ICD-10-CM

## 2015-09-13 DIAGNOSIS — I10 Essential (primary) hypertension: Secondary | ICD-10-CM | POA: Diagnosis not present

## 2015-09-13 DIAGNOSIS — Z23 Encounter for immunization: Secondary | ICD-10-CM

## 2015-09-13 DIAGNOSIS — IMO0001 Reserved for inherently not codable concepts without codable children: Secondary | ICD-10-CM

## 2015-09-13 DIAGNOSIS — R21 Rash and other nonspecific skin eruption: Secondary | ICD-10-CM

## 2015-09-13 MED ORDER — DAPAGLIFLOZIN PROPANEDIOL 10 MG PO TABS: 10.0000 mg | ORAL_TABLET | Freq: Every day | ORAL | Status: DC

## 2015-09-13 MED ORDER — METFORMIN HCL 1000 MG PO TABS: 1000.0000 mg | ORAL_TABLET | Freq: Two times a day (BID) | ORAL | Status: DC

## 2015-09-13 MED ORDER — LISINOPRIL 5 MG PO TABS: 5.0000 mg | ORAL_TABLET | Freq: Every day | ORAL | Status: DC

## 2015-09-13 MED ORDER — TERBINAFINE HCL 1 % EX CREA: 1.0000 "application " | TOPICAL_CREAM | Freq: Two times a day (BID) | CUTANEOUS | Status: DC

## 2015-09-13 NOTE — Progress Notes (Signed)
09/14/2015 11:24 AM   DOB: May 06, 1971 / MRN: [id number]  SUBJECTIVE:  Trevor Medina is a 44 y.o. male presenting for a check for his prediabetes that was diagnosed before receiving surgery.  Has been taking Metformin that was prescribed by another provider but has run out for about 1 month.  He denies sock and glove paresthesia, nocturia, and excessive thirst.  Denies vision abnormalities and headache.    Complains of a rash on his penis that started about 5 days ago.  He is uncircumsized and has had this problem before, and takes a Nystatin that makes it goes ago.  Denies itching and painful rash.  Denies dysuria, frequency and urgency.   Reports he does have some pain with foreskin retraction, but he is able.   He is a non smoker.  He takes 81 mg ASA.  He checks his BP on an outpatient basis and it runs 130/normal.     He has No Known Allergies.   He  has a past medical history of Chronic back pain; HNP (herniated nucleus pulposus), lumbar; Spinal stenosis; History of gout; and Diabetes mellitus without complication (HCC).    He  reports that he has never smoked. He has never used smokeless tobacco. He reports that he drinks about 1.2 oz of alcohol per week. He reports that he does not use illicit drugs. He  has no sexual activity history on file. The patient  has past surgical history that includes Ankle surgery and Lumbar laminectomy/decompression microdiscectomy (Bilateral, 07/07/2015).  His family history is not on file.  Review of Systems  Constitutional: Negative for fever and chills.  Eyes: Negative for blurred vision.  Respiratory: Negative for cough and shortness of breath.   Cardiovascular: Negative for chest pain.  Gastrointestinal: Negative for nausea and abdominal pain.  Genitourinary: Negative for dysuria, urgency and frequency.  Musculoskeletal: Negative for myalgias.  Skin: Negative for rash.  Neurological: Negative for dizziness, tingling and headaches.    Endo/Heme/Allergies: Negative for polydipsia.  Psychiatric/Behavioral: Negative for depression. The patient is not nervous/anxious.     Problem list and medications reviewed and updated by myself where necessary, and exist elsewhere in the encounter.   OBJECTIVE:  BP 144/96 mmHg  Pulse 86  Temp(Src) 98.6 F (37 C) (Oral)  Resp 18  Ht  (1.753 m)  Wt 220 lb (99.791 kg)  BMI 32.47 kg/m2  SpO2 98% CrCl cannot be calculated (Patient has no serum creatinine result on file.).  Physical Exam  Constitutional: He is oriented to person, place, and time. He appears well-developed and well-nourished.  He is obese.   HENT:  Head: Normocephalic and atraumatic.  Right Ear: Hearing, tympanic membrane, external ear and ear canal normal.  Left Ear: Hearing, tympanic membrane, external ear and ear canal normal.  Nose: Nose normal.  Mouth/Throat: Uvula is midline, oropharynx is clear and moist and mucous membranes are normal.  Eyes: Conjunctivae, EOM and lids are normal. Pupils are equal, round, and reactive to light. Right eye exhibits no discharge. Left eye exhibits no discharge. No scleral icterus.  Neck: Trachea normal and normal range of motion. Neck supple. Carotid bruit is not present.  Cardiovascular: Normal rate, regular rhythm, normal heart sounds, intact distal pulses and normal pulses.   No murmur heard. Pulmonary/Chest: Effort normal and breath sounds normal. No respiratory distress. He has no wheezes. He has no rhonchi. He has no rales.  Abdominal: Soft. Normal appearance and bowel sounds are normal. He exhibits no abdominal bruit. There  is no tenderness.  Musculoskeletal: Normal range of motion. He exhibits no edema or tenderness.  Lymphadenopathy:       Head (right side): No submental, no submandibular, no tonsillar, no preauricular, no posterior auricular and no occipital adenopathy present.       Head (left side): No submental, no submandibular, no tonsillar, no preauricular,  no posterior auricular and no occipital adenopathy present.    He has no cervical adenopathy.  Neurological: He is alert and oriented to person, place, and time. He has normal strength and normal reflexes. No cranial nerve deficit or sensory deficit. Coordination and gait normal.  Skin: Skin is warm, dry and intact. No lesion and no rash noted.  Psychiatric: He has a normal mood and affect. His speech is normal and behavior is normal. Judgment and thought content normal.   Diabetic Foot Exam - Simple   Simple Foot Form  Visual Inspection  No deformities, no ulcerations, no other skin breakdown bilaterally:  Yes  Sensation Testing  Intact to touch and monofilament testing bilaterally:  Yes  Pulse Check  Posterior Tibialis and Dorsalis pulse intact bilaterally:  Yes  Comments       No results found for this or any previous visit (from the past 48 hour(s)).  ASSESSMENT AND PLAN  Zaniel was seen today for hyperglycemia and rash.  Diagnoses and all orders for this visit:  Uncontrolled type 2 diabetes mellitus without complication, without long-term current use of insulin [facility](HCC): Patient with very high A1C.  He is mildly hypertensive and will start Lisinopril.  Will start metformin and SGLT 2 inhibitor and will have this doses maximized after 7 days. Problem 2 likely secondary to this problem.  Will see him back in three months.  Advised that he pursue weight loss and prudent diet for now and will send to diabetes education.  He needs an eye exam.   -     metFORMIN (GLUCOPHAGE) 1000 MG tablet; Take 1 tablet (1,000 mg total) by mouth 2 (two) times daily with a meal. Start with 1 in the morning and night for the first week.  Then move to the full dose. -     dapagliflozin propanediol (FARXIGA) 10 MG TABS tablet; Take 10 mg by mouth daily. Take 5 mg for the first week, then the full dose thereafter. -     Hemoglobin A1c -     COMPLETE METABOLIC PANEL WITH GFR -     Ambulatory referral to  Nutrition and Diabetic Education -     Microalbumin, urine -     Flu Vaccine QUAD 36+ mos IM -     lisinopril (PRINIVIL,ZESTRIL) 5 MG tablet; Take 1 tablet (5 mg total) by mouth daily. -     Pneumococcal polysaccharide vaccine 23-valent greater than or equal to 2yo subcutaneous/IM  Penile rash -     terbinafine (LAMISIL) 1 % cream; Apply 1 application topically 2 (two) times daily.  Other orders -     Cancel: Pneumococcal conjugate vaccine 13-valent    The patient was advised to call or return to clinic if he does not see an improvement in symptoms or to seek the care of the closest emergency department if he worsens with the above plan.   Deliah BostonMichael Jozeph Persing, MHS, PA-C [facility] 09/14/2015 11:24 AM

## 2015-09-14 DIAGNOSIS — I10 Essential (primary) hypertension: Secondary | ICD-10-CM | POA: Insufficient documentation

## 2015-09-14 LAB — COMPLETE METABOLIC PANEL WITH GFR
ALT: 13 U/L (ref 9–46)
AST: 11 U/L (ref 10–40)
Albumin: 4.1 g/dL (ref 3.6–5.1)
Alkaline Phosphatase: 71 U/L (ref 40–115)
BILIRUBIN TOTAL: 0.6 mg/dL (ref 0.2–1.2)
BUN: 19 mg/dL (ref 7–25)
CALCIUM: 9.7 mg/dL (ref 8.6–10.3)
CO2: 26 mmol/L (ref 20–31)
CREATININE: 0.9 mg/dL (ref 0.60–1.35)
Chloride: 97 mmol/L — ABNORMAL LOW (ref 98–110)
Glucose, Bld: 243 mg/dL — ABNORMAL HIGH (ref 65–99)
Potassium: 4.5 mmol/L (ref 3.5–5.3)
Sodium: 134 mmol/L — ABNORMAL LOW (ref 135–146)
TOTAL PROTEIN: 7.7 g/dL (ref 6.1–8.1)

## 2015-09-14 LAB — MICROALBUMIN, URINE: MICROALB UR: 24.3 mg/dL

## 2015-09-15 LAB — HEMOGLOBIN A1C
HEMOGLOBIN A1C: 11 % — AB (ref <5.7)
Mean Plasma Glucose: 269 mg/dL — ABNORMAL HIGH (ref <117)

## 2015-10-06 ENCOUNTER — Ambulatory Visit: Payer: Self-pay | Admitting: *Deleted

## 2015-11-09 ENCOUNTER — Ambulatory Visit: Payer: Self-pay | Admitting: *Deleted

## 2015-12-08 ENCOUNTER — Ambulatory Visit: Payer: PRIVATE HEALTH INSURANCE | Admitting: Physician Assistant

## 2015-12-15 ENCOUNTER — Ambulatory Visit: Payer: PRIVATE HEALTH INSURANCE | Admitting: Physician Assistant

## 2016-01-26 ENCOUNTER — Emergency Department (HOSPITAL_BASED_OUTPATIENT_CLINIC_OR_DEPARTMENT_OTHER): Payer: Self-pay

## 2016-01-26 ENCOUNTER — Encounter (HOSPITAL_BASED_OUTPATIENT_CLINIC_OR_DEPARTMENT_OTHER): Payer: Self-pay | Admitting: Emergency Medicine

## 2016-01-26 ENCOUNTER — Emergency Department (HOSPITAL_BASED_OUTPATIENT_CLINIC_OR_DEPARTMENT_OTHER)
Admission: EM | Admit: 2016-01-26 | Discharge: 2016-01-26 | Disposition: A | Discharge status: Home / Self Care | Payer: Self-pay | Attending: Emergency Medicine | Admitting: Emergency Medicine

## 2016-01-26 DIAGNOSIS — Z794 Long term (current) use of insulin: Secondary | ICD-10-CM | POA: Insufficient documentation

## 2016-01-26 DIAGNOSIS — E119 Type 2 diabetes mellitus without complications: Secondary | ICD-10-CM | POA: Insufficient documentation

## 2016-01-26 DIAGNOSIS — M5416 Radiculopathy, lumbar region: Secondary | ICD-10-CM | POA: Insufficient documentation

## 2016-01-26 DIAGNOSIS — Z79899 Other long term (current) drug therapy: Secondary | ICD-10-CM | POA: Insufficient documentation

## 2016-01-26 MED ORDER — OXYCODONE-ACETAMINOPHEN 5-325 MG PO TABS: 1.0000 | ORAL_TABLET | ORAL | Status: DC | PRN

## 2016-01-26 MED ORDER — FLUCONAZOLE 100 MG PO TABS: 100.0000 mg | ORAL_TABLET | Freq: Once | ORAL | Status: DC

## 2016-01-26 MED ORDER — OXYCODONE-ACETAMINOPHEN 5-325 MG PO TABS
1.0000 | ORAL_TABLET | Freq: Once | ORAL | Status: AC
Start: 2016-01-26 — End: 2016-01-26
  Administered 2016-01-26: 1 via ORAL
  Filled 2016-01-26: qty 1

## 2016-01-26 MED ORDER — ALBUTEROL SULFATE HFA 108 (90 BASE) MCG/ACT IN AERS: 1.0000 | INHALATION_SPRAY | Freq: Four times a day (QID) | RESPIRATORY_TRACT | Status: DC | PRN

## 2016-01-26 MED FILL — OXYCODONE/APAP 5-325: 5-325 | 3 days supply | Qty: 20 | Fill #0

## 2016-01-26 MED FILL — PROVENTIL HFA 90 MCG INH: 108 (90 BAS | 30 days supply | Qty: 7 | Fill #0

## 2016-01-26 NOTE — ED Provider Notes (Signed)
CSN: [id number]     Arrival date & time 01/26/16  4431 History   First MD Initiated Contact with Patient 01/26/16 [phone]     Chief Complaint  Patient presents with  . Leg Pain     (Consider location/radiation/quality/duration/timing/severity/associated sxs/prior Treatment) HPI Comments: 45 year old male with history of lumbar microdiscectomy in October 2016 following a fall at work presents for left leg pain. The patient reports that over 1 week ago he walked about 45 minutes the mall and then was walking around the mall when he started to develop pain in his left hip and calf. He said it feels like a shooting sharp pain. He says it feels like it shoots from his calf to his hip area. It's worse when he is up on his feet and moving around. He has also experienced it when he's been sitting on hard surfaces for an extended period of time. He denies any fevers or chills. No known injury to the back. He denies any leg swelling. He cannot reproduce the pain by squeezing on his leg. No rashes. Reports normal bowel bladder function. Normal sensation and strength.   Past Medical History  Diagnosis Date  . Chronic back pain   . HNP (herniated nucleus pulposus), lumbar   . Spinal stenosis   . History of gout   . Diabetes mellitus without complication ([place])     no meds - was given Rx for Metformin in March 2016 at urgent care ([facility]) but never filled Rx. Planned to treat with "diet and exercise"   Past Surgical History  Procedure Laterality Date  . Ankle surgery    . Lumbar laminectomy/decompression microdiscectomy Bilateral 07/07/2015    Procedure: DECOMPRESSION L5-S1 BILATERALLY;  Surgeon: Susa Day, MD;  Location: [facility];  Service: Orthopedics;  Laterality: Bilateral;   History reviewed. No pertinent family history. Social History  Substance Use Topics  . Smoking status: Never Smoker   . Smokeless tobacco: Never Used  . Alcohol Use: 1.2 oz/week    2 Standard drinks or equivalent per  week     Comment: occasional    Review of Systems  Constitutional: Negative for fever, chills and fatigue.  HENT: Negative for congestion, postnasal drip, rhinorrhea and sinus pressure.   Respiratory: Negative for cough, chest tightness and shortness of breath.   Cardiovascular: Negative for chest pain, palpitations and leg swelling.  Gastrointestinal: Negative for nausea, vomiting, abdominal pain, diarrhea and constipation.  Genitourinary: Negative for dysuria, urgency and hematuria.  Musculoskeletal: Positive for back pain (left hip and lower back extending to the left calf). Negative for myalgias, neck pain and neck stiffness.  Skin: Negative for rash and wound.  Neurological: Negative for dizziness, seizures, syncope, weakness and numbness.  Hematological: Does not bruise/bleed easily.      Allergies  Review of patient's allergies indicates no known allergies.  Home Medications   Prior to Admission medications   Medication Sig Start Date End Date Taking? Authorizing Provider  albuterol (PROVENTIL HFA;VENTOLIN HFA) 108 (90 BASE) MCG/ACT inhaler Inhale 1-2 puffs into the lungs every 6 (six) hours as needed for wheezing or shortness of breath. 12/22/14   Ezequiel Essex, MD  Alcohol Swabs (ALCOHOL PADS) 70 % PADS 1 application by Does not apply route daily. Patient not taking: Reported on 09/13/2015 07/08/15   Nishant Dhungel, MD  aspirin 81 MG tablet Take 81 mg by mouth daily.    Historical Provider, MD  dapagliflozin propanediol (FARXIGA) 10 MG TABS tablet Take 10 mg by mouth daily.  Take 5 mg for the first week, then the full dose thereafter. 09/13/15   Tereasa Coop, PA-C  docusate sodium (COLACE) 100 MG capsule Take 1 capsule (100 mg total) by mouth 2 (two) times daily as needed for mild constipation. Patient not taking: Reported on 09/13/2015 07/07/15   Susa Day, MD  gabapentin (NEURONTIN) 300 MG capsule Take 300 mg by mouth 3 (three) times daily.    Historical Provider, MD   glimepiride (AMARYL) 2 MG tablet Take 1 tablet (2 mg total) by mouth daily with breakfast. Patient not taking: Reported on 09/13/2015 07/08/15   Nishant Dhungel, MD  glucose monitoring kit (FREESTYLE) monitoring kit 1 each by Does not apply route 4 (four) times daily - after meals and at bedtime. 1 month Diabetic Testing Supplies for QAC-QHS accuchecks. Patient not taking: Reported on 09/13/2015 07/08/15   Flonnie Overman Dhungel, MD  Lancets (ACCU-CHEK MULTICLIX) lancets daily Patient not taking: Reported on 09/13/2015 07/08/15   Nishant Dhungel, MD  lisinopril (PRINIVIL,ZESTRIL) 5 MG tablet Take 1 tablet (5 mg total) by mouth daily. 09/13/15   Tereasa Coop, PA-C  meloxicam (MOBIC) 7.5 MG tablet Take 1 tablet (7.5 mg total) by mouth every morning. May resume 5 days post-op Patient not taking: Reported on 09/13/2015 07/08/15   Cecilie Kicks, PA-C  metFORMIN (GLUCOPHAGE) 1000 MG tablet Take 1 tablet (1,000 mg total) by mouth 2 (two) times daily with a meal. Start with 1 in the morning and night for the first week.  Then move to the full dose. 09/13/15   Tereasa Coop, PA-C  methocarbamol (ROBAXIN) 500 MG tablet Take 1 tablet (500 mg total) by mouth 3 (three) times daily. 07/07/15   Susa Day, MD  oxyCODONE-acetaminophen (PERCOCET) 5-325 MG tablet Take 1 tablet by mouth every 4 (four) hours as needed. 07/07/15   Susa Day, MD  terbinafine (LAMISIL) 1 % cream Apply 1 application topically 2 (two) times daily. 09/13/15   Tereasa Coop, PA-C   BP 183/108 mmHg  Pulse 98  Temp(Src) 98.2 F (36.8 C) (Oral)  Resp 18  Ht _0  (1.778 m)  Wt 216 lb (97.977 kg)  BMI 30.99 kg/m2  SpO2 97% Physical Exam  Constitutional: He is oriented to person, place, and time. He appears well-developed and well-nourished. No distress.  HENT:  Head: Normocephalic and atraumatic.  Right Ear: External ear normal.  Left Ear: External ear normal.  Mouth/Throat: Oropharynx is clear and moist. No oropharyngeal  exudate.  Eyes: EOM are normal. Pupils are equal, round, and reactive to light.  Neck: Normal range of motion. Neck supple.  Cardiovascular: Normal rate, regular rhythm, normal heart sounds and intact distal pulses.   No murmur heard. Pulses:      Dorsalis pedis pulses are 2+ on the right side, and 2+ on the left side.  Pulmonary/Chest: Effort normal. No respiratory distress. He has no wheezes. He has no rales.  Abdominal: Soft. He exhibits no distension. There is no tenderness.  Musculoskeletal: He exhibits no edema.  Reports pain at the left hip and down his leg when left leg is raised. No pain with squeezing of the thigh or the calf. Patient is able to stand on his heels as well as on his toes and ambulate normally.  Neurological: He is alert and oriented to person, place, and time. He has normal strength. No sensory deficit. Gait normal.  No foot drop. No saddle anesthesia.  Skin: Skin is warm and dry. No rash noted. He is  not diaphoretic.  Vitals reviewed.   ED Course  Procedures (including critical care time) Labs Review Labs Reviewed - No data to display  Imaging Review Dg Lumbar Spine Complete  01/26/2016  CLINICAL DATA:  Left lower back pain. EXAM: LUMBAR SPINE - COMPLETE 4+ VIEW COMPARISON:  11/06/2014 intraoperative imaging. Preoperative imaging 06/30/2015 FINDINGS: There are five lumbar-type vertebral bodies. No fracture or malalignment. Disc space narrowing at L5-S1. 9 mm calcification adjacent to the L3 vertebral body on the left. This was present on prior study but was located more inferiorly near L4. Cannot exclude ureteral stone. Recommend clinical correlation for flank pain and hematuria. SI joints are symmetric. IMPRESSION: No acute bony abnormality.  Degenerative disc disease at L5-S1. 9 mm calcification to the left of the L3 vertebral body. This could reflect a calcified phlebolith, but cannot exclude mid left ureteral stone. Recommend clinical correlation for flank pain  and hematuria. Electronically Signed   By: Rolm Baptise M.D.   On: 01/26/2016 11:01   I have personally reviewed and evaluated these images and lab results as part of my medical decision-making.   EKG Interpretation None      MDM  Patient was seen and evaluated in stable condition. Stable vitals other than hypertension consistent with history. No abdominal pain. History consistent with sciatica or radicular pain. She was given a Percocet with improvement. Neurovascularly intact. No red flag symptoms on examination. Degenerative disc disease seen on x-ray at L5-S1. Patient and brother updated on results and clinical impression at bedside. Patient has 800 mg ibuprofen prescription and muscle relaxant prescription at home. We will provide him with a prescription for Percocet. Patient to follow up outpatient. Given strict return precautions. Final diagnoses:  None    1. Lumbar radiculopathy    Harvel Quale, MD 01/26/16 1141

## 2016-01-26 NOTE — ED Notes (Signed)
S/p lumbar microdiscectomy October 2016, progressing well. This past Wednesday after walking to the mall, he started experiencing left calf pain. No swelling or numbness. +radiation to left hip.   +palp DP/PT pulses. Good motor and sensory.

## 2016-01-26 NOTE — Discharge Instructions (Signed)
You were seen and evaluated today for your leg pain. It does not appear that you have a blood clot at this time. This sounds most consistent with what called sciatica or lumbar radiculopathy in which the nerve gets irritated when it comes off the spine. This was likely related to the large amount of exercise he did the day it started. Rest and do light exercise. Do not partake in anything that causes severe pain. Take the pain medicine prescribed. Use ice or heat over your hip area to help with the symptoms. Return immediately for fever, loss of bowel or bladder control, decreased sensation, inability to walk. Follow up outpatient for reevaluation.  Lumbosacral Radiculopathy Lumbosacral radiculopathy is a condition that involves the spinal nerves and nerve roots in the low back and bottom of the spine. The condition develops when these nerves and nerve roots move out of place or become inflamed and cause symptoms. CAUSES This condition may be caused by: 1. Pressure from a disk that bulges out of place (herniated disk). A disk is a plate of cartilage that separates bones in the spine. 2. Disk degeneration. 3. A narrowing of the bones of the lower back (spinal stenosis). 4. A tumor. 5. An infection. 6. An injury that places sudden pressure on the disks that cushion the bones of your lower spine. RISK FACTORS This condition is more likely to develop in: 1. Males aged 30-50 years. 2. Females aged 50-60 years. 3. People who lift improperly. 4. People who are overweight or live a sedentary lifestyle. 5. People who smoke. 6. People who perform repetitive activities that strain the spine. SYMPTOMS Symptoms of this condition include: 1. Pain that goes down from the back into the legs (sciatica). This is the most common symptom. The pain may be worse with sitting, coughing, or sneezing. 2. Pain and numbness in the arms and legs. 3. Muscle weakness. 4. Tingling. 5. Loss of bladder control or bowel  control. DIAGNOSIS This condition is diagnosed with a physical exam and medical history. If the pain is lasting, you may have tests, such as: 1. MRI scan. 2. X-ray. 3. CT scan. 4. Myelogram. 5. Nerve conduction study. TREATMENT This condition is often treated with: 1. Hot packs and ice applied to affected areas. 2. Stretches to improve flexibility. 3. Exercises to strengthen back muscles. 4. Physical therapy. 5. Pain medicine. 6. A steroid injection in the spine. In some cases, no treatment is needed. If the condition is long-lasting (chronic), or if symptoms are severe, treatment may involve surgery or lifestyle changes, such as following a weight loss plan. HOME CARE INSTRUCTIONS Medicines 1. Take medicines only as directed by your health care provider. 2. Do not drive or operate heavy machinery while taking pain medicine. Injury Care 1. Apply a heat pack to the injured area as directed by your health care provider. 2. Apply ice to the affected area: 1. Put ice in a plastic bag. 2. Place a towel between your skin and the bag. 3. Leave the ice on for 20-30 minutes, every 2 hours while you are awake or as needed. Or, leave the ice on for as long as directed by your health care provider. Other Instructions  If you were shown how to do any exercises or stretches, do them as directed by your health care provider.  If your health care provider prescribed a diet or exercise program, follow it as directed.  Keep all follow-up visits as directed by your health care provider. This is important.  SEEK MEDICAL CARE IF:  Your pain does not improve over time even when taking pain medicines. SEEK IMMEDIATE MEDICAL CARE IF:  Your develop severe pain.  Your pain suddenly gets worse.  You develop increasing weakness in your legs.  You lose the ability to control your bladder or bowel.  You have difficulty walking or balancing.  You have a fever.   This information is not intended to  replace advice given to you by your health care provider. Make sure you discuss any questions you have with your health care provider.   Document Released: 09/18/2005 Document Revised: 02/02/2015 Document Reviewed: 09/14/2014 Elsevier Interactive Patient Education 2016 [employer]   Back Exercises The following exercises strengthen the muscles that help to support the back. They also help to keep the lower back flexible. Doing these exercises can help to prevent back pain or lessen existing pain. If you have back pain or discomfort, try doing these exercises 2-3 times each day or as told by your health care provider. When the pain goes away, do them once each day, but increase the number of times that you repeat the steps for each exercise (do more repetitions). If you do not have back pain or discomfort, do these exercises once each day or as told by your health care provider. EXERCISES Single Knee to Chest Repeat these steps 3-5 times for each leg: 7. Lie on your back on a firm bed or the floor with your legs extended. 8. Bring one knee to your chest. Your other leg should stay extended and in contact with the floor. 9. Hold your knee in place by grabbing your knee or thigh. 10. Pull on your knee until you feel a gentle stretch in your lower back. 11. Hold the stretch for 10-30 seconds. 12. Slowly release and straighten your leg. Pelvic Tilt Repeat these steps 5-10 times: 7. Lie on your back on a firm bed or the floor with your legs extended. 8. Bend your knees so they are pointing toward the ceiling and your feet are flat on the floor. 9. Tighten your lower abdominal muscles to press your lower back against the floor. This motion will tilt your pelvis so your tailbone points up toward the ceiling instead of pointing to your feet or the floor. 10. With gentle tension and even breathing, hold this position for 5-10 seconds. Cat-Cow Repeat these steps until your lower back becomes more  flexible: 6. Get into a hands-and-knees position on a firm surface. Keep your hands under your shoulders, and keep your knees under your hips. You may place padding under your knees for comfort. 7. Let your head hang down, and point your tailbone toward the floor so your lower back becomes rounded like the back of a cat. 8. Hold this position for 5 seconds. 9. Slowly lift your head and point your tailbone up toward the ceiling so your back forms a sagging arch like the back of a cow. 10. Hold this position for 5 seconds. Press-Ups Repeat these steps 5-10 times: 6. Lie on your abdomen (face-down) on the floor. 7. Place your palms near your head, about shoulder-width apart. 8. While you keep your back as relaxed as possible and keep your hips on the floor, slowly straighten your arms to raise the top half of your body and lift your shoulders. Do not use your back muscles to raise your upper torso. You may adjust the placement of your hands to make yourself more comfortable. 9. Hold this  position for 5 seconds while you keep your back relaxed. 10. Slowly return to lying flat on the floor. Bridges Repeat these steps 10 times: 7. Lie on your back on a firm surface. 8. Bend your knees so they are pointing toward the ceiling and your feet are flat on the floor. 9. Tighten your buttocks muscles and lift your buttocks off of the floor until your waist is at almost the same height as your knees. You should feel the muscles working in your buttocks and the back of your thighs. If you do not feel these muscles, slide your feet 1-2 inches farther away from your buttocks. 10. Hold this position for 3-5 seconds. 11. Slowly lower your hips to the starting position, and allow your buttocks muscles to relax completely. If this exercise is too easy, try doing it with your arms crossed over your chest. Abdominal Crunches Repeat these steps 5-10 times: 3. Lie on your back on a firm bed or the floor with your legs  extended. 4. Bend your knees so they are pointing toward the ceiling and your feet are flat on the floor. 5. Cross your arms over your chest. 6. Tip your chin slightly toward your chest without bending your neck. 7. Tighten your abdominal muscles and slowly raise your trunk (torso) high enough to lift your shoulder blades a tiny bit off of the floor. Avoid raising your torso higher than that, because it can put too much stress on your low back and it does not help to strengthen your abdominal muscles. 8. Slowly return to your starting position. Back Lifts Repeat these steps 5-10 times: 3. Lie on your abdomen (face-down) with your arms at your sides, and rest your forehead on the floor. 4. Tighten the muscles in your legs and your buttocks. 5. Slowly lift your chest off of the floor while you keep your hips pressed to the floor. Keep the back of your head in line with the curve in your back. Your eyes should be looking at the floor. 6. Hold this position for 3-5 seconds. 7. Slowly return to your starting position. SEEK MEDICAL CARE IF:  Your back pain or discomfort gets much worse when you do an exercise.  Your back pain or discomfort does not lessen within 2 hours after you exercise. If you have any of these problems, stop doing these exercises right away. Do not do them again unless your health care provider says that you can. SEEK IMMEDIATE MEDICAL CARE IF:  You develop sudden, severe back pain. If this happens, stop doing the exercises right away. Do not do them again unless your health care provider says that you can.   This information is not intended to replace advice given to you by your health care provider. Make sure you discuss any questions you have with your health care provider.   Document Released: 10/26/2004 Document Revised: 06/09/2015 Document Reviewed: 11/12/2014 Elsevier Interactive Patient Education [employer]

## 2017-07-05 ENCOUNTER — Emergency Department (HOSPITAL_BASED_OUTPATIENT_CLINIC_OR_DEPARTMENT_OTHER): Payer: Commercial Managed Care - PPO

## 2017-07-05 ENCOUNTER — Emergency Department (HOSPITAL_BASED_OUTPATIENT_CLINIC_OR_DEPARTMENT_OTHER)
Admission: EM | Admit: 2017-07-05 | Discharge: 2017-07-05 | Disposition: A | Discharge status: Home / Self Care | Payer: Commercial Managed Care - PPO | Attending: Physician Assistant | Admitting: Physician Assistant

## 2017-07-05 ENCOUNTER — Encounter (HOSPITAL_BASED_OUTPATIENT_CLINIC_OR_DEPARTMENT_OTHER): Payer: Self-pay | Admitting: Emergency Medicine

## 2017-07-05 DIAGNOSIS — M79645 Pain in left finger(s): Secondary | ICD-10-CM

## 2017-07-05 DIAGNOSIS — E119 Type 2 diabetes mellitus without complications: Secondary | ICD-10-CM | POA: Insufficient documentation

## 2017-07-05 DIAGNOSIS — Z79899 Other long term (current) drug therapy: Secondary | ICD-10-CM | POA: Diagnosis not present

## 2017-07-05 DIAGNOSIS — I10 Essential (primary) hypertension: Secondary | ICD-10-CM | POA: Diagnosis not present

## 2017-07-05 DIAGNOSIS — Z7982 Long term (current) use of aspirin: Secondary | ICD-10-CM | POA: Insufficient documentation

## 2017-07-05 DIAGNOSIS — M79642 Pain in left hand: Secondary | ICD-10-CM | POA: Diagnosis present

## 2017-07-05 NOTE — ED Provider Notes (Signed)
[facility] Provider Note   CSN: [id number] Arrival date & time: 07/05/17  2029     History   Chief Complaint Chief Complaint  Patient presents with  . Hand Pain    HPI Trevor Medina is a 46 y.o. male with a history of diabetes who presents to the emergency department today for left thumb pain. The patient is right-hand dominant. 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 He notes that over the last 3 days he has noticed a sensation of his left thumb locking and clicking during finger movement. There is mild pain associated with movement. There is no erythema, swelling or decreased range of motion. The patient denies any trauma, fever, numbness, tingling or weakness.  HPI  Past Medical History:  Diagnosis Date  . Chronic back pain   . Diabetes mellitus without complication ([place])    no meds - was given Rx for Metformin in March 2016 at urgent care ([facility]) but never filled Rx. Planned to treat with "diet and exercise"  . History of gout   . HNP (herniated nucleus pulposus), lumbar   . Spinal stenosis     Patient Active Problem List   Diagnosis Date Noted  . Essential hypertension 09/14/2015  . HNP (herniated nucleus pulposus), lumbar 07/07/2015  . Uncontrolled diabetes mellitus ([place]) 07/07/2015  . Obesity 07/07/2015    Past Surgical History:  Procedure Laterality Date  . ANKLE SURGERY    . LUMBAR LAMINECTOMY/DECOMPRESSION MICRODISCECTOMY Bilateral 07/07/2015   Procedure: DECOMPRESSION L5-S1 BILATERALLY;  Surgeon: Susa Day, MD;  Location: [facility];  Service: Orthopedics;  Laterality: Bilateral;       Home Medications    Prior to Admission medications   Medication Sig Start Date End Date Taking? Authorizing Provider  albuterol (PROVENTIL HFA;VENTOLIN HFA) 108 (90 Base) MCG/ACT inhaler Inhale 1-2 puffs into the lungs  every 6 (six) hours as needed for wheezing or shortness of breath. 01/26/16   Harvel Quale, MD  Alcohol Swabs (ALCOHOL PADS) 70 % PADS 1 application by Does not apply route daily. Patient not taking: Reported on 09/13/2015 07/08/15   Dhungel, Flonnie Overman, MD  aspirin 81 MG tablet Take 81 mg by mouth daily.    [provider]  dapagliflozin propanediol (FARXIGA) 10 MG TABS tablet Take 10 mg by mouth daily. Take 5 mg for the first week, then the full dose thereafter. 09/13/15   Tereasa Coop, PA-C  docusate sodium (COLACE) 100 MG capsule Take 1 capsule (100 mg total) by mouth 2 (two) times daily as needed for mild constipation. Patient not taking: Reported on 09/13/2015 07/07/15   Susa Day, MD  gabapentin (NEURONTIN) 300 MG capsule Take 300 mg by mouth 3 (three) times daily.    [provider]  glimepiride (AMARYL) 2 MG tablet Take 1 tablet (2 mg total) by mouth daily with breakfast. Patient not taking: Reported on 09/13/2015 07/08/15   Dhungel, Flonnie Overman, MD  glucose monitoring kit (FREESTYLE) monitoring kit 1 each by Does not apply route 4 (four) times daily - after meals and at bedtime. 1 month Diabetic Testing Supplies for QAC-QHS accuchecks. Patient not taking: Reported on 09/13/2015 07/08/15   Dhungel, Flonnie Overman, MD  Lancets (ACCU-CHEK MULTICLIX) lancets daily Patient not taking: Reported on 09/13/2015 07/08/15   Dhungel, Flonnie Overman, MD  lisinopril (PRINIVIL,ZESTRIL) 5 MG tablet Take 1 tablet (5 mg total)  by mouth daily. 09/13/15   Tereasa Coop, PA-C  meloxicam (MOBIC) 7.5 MG tablet Take 1 tablet (7.5 mg total) by mouth every morning. May resume 5 days post-op Patient not taking: Reported on 09/13/2015 07/08/15   Cecilie Kicks, PA-C  metFORMIN (GLUCOPHAGE) 1000 MG tablet Take 1 tablet (1,000 mg total) by mouth 2 (two) times daily with a meal. Start with 1 in the morning and night for the first week.  Then move to the full dose. 09/13/15   Tereasa Coop, PA-C    methocarbamol (ROBAXIN) 500 MG tablet Take 1 tablet (500 mg total) by mouth 3 (three) times daily. 07/07/15   Susa Day, MD  oxyCODONE-acetaminophen (PERCOCET) 5-325 MG tablet Take 1 tablet by mouth every 4 (four) hours as needed for moderate pain or severe pain. 01/26/16   Harvel Quale, MD  terbinafine (LAMISIL) 1 % cream Apply 1 application topically 2 (two) times daily. 09/13/15   Tereasa Coop, PA-C    Family History History reviewed. No pertinent family history.  Social History Social History  Substance Use Topics  . Smoking status: Never Smoker  . Smokeless tobacco: Never Used  . Alcohol use 1.2 oz/week    2 Standard drinks or equivalent per week     Comment: occasional     Allergies   Patient has no known allergies.   Review of Systems Review of Systems  Constitutional: Negative for chills and fever.  Musculoskeletal: Positive for arthralgias. Negative for joint swelling.  Skin: Negative for wound.  Neurological: Negative for weakness and numbness.     Physical Exam Updated Vital Signs BP (!) 147/95   Pulse 75   Temp 98.6 F (37 C) (Oral)   Resp 16   Ht _0  (1.778 m)   Wt 99.3 kg (219 lb)   SpO2 99%   BMI 31.42 kg/m   Physical Exam  Constitutional: He appears well-developed and well-nourished.  HENT:  Head: Normocephalic and atraumatic.  Right Ear: External ear normal.  Left Ear: External ear normal.  Eyes: Conjunctivae are normal. Right eye exhibits no discharge. Left eye exhibits no discharge. No scleral icterus.  Pulmonary/Chest: Effort normal. No respiratory distress.  Musculoskeletal:  Left hand: No gross deformities, skin intact. Fingers appear normal. No TTP over flexor sheath. TTP over 1st digit MCP and IP. There is locking and clicking of IP of the 1st digit on exam. Tendon nodule felt at base of thumb. No snuffbox TTP. Finger adduction/abduction intact with 5/5 strength. Thumb opposition intact. Full active and resisted ROM to  flexion/extension at wrist, MCP, PIP and DIP of digits 2-5. Radial artery 2+ with <2sec cap refill. SILT in M/U/R distributions. Grip 5/5 strength.   Neurological: He is alert.  Skin: No pallor.  Psychiatric: He has a normal mood and affect.  Nursing note and vitals reviewed.    ED Treatments / Results  Labs (all labs ordered are listed, but only abnormal results are displayed) Labs Reviewed - No data to display  EKG  EKG Interpretation None       Radiology Dg Hand Complete Left  Result Date: 07/05/2017 CLINICAL DATA:  Left hand pain 5 days.  No injury. EXAM: LEFT HAND - COMPLETE 3+ VIEW COMPARISON:  None. FINDINGS: Moderate degenerative change over the radiocarpal joint and distal radioulnar joint. Mild degenerate change over the carpal bones. No definite erosive change. Alignment and mineralization is within normal. IMPRESSION: Proximal predominant degenerative changes mainly involving the radiocarpal joint and distal radioulnar  joints. No definite erosive change. Electronically Signed   By: Marin Olp M.D.   On: 07/05/2017 21:35    Procedures Procedures (including critical care time)  Medications Ordered in ED Medications - No data to display   Initial Impression / Assessment and Plan / ED Course  I have reviewed the triage vital signs and the nursing notes.  Pertinent labs & imaging results that were available during my care of the patient were reviewed by me and considered in my medical decision making (see chart for details).     Patient exam consistent with trigger finger of the thumb of patient non-dominant hand. There is locking of the thumb with flexion and palpable nodule at base of thumb. Patient X-Ray negative for obvious fracture or dislocation. Will immobilize. Pt advised to follow up with hand for further treatment. Conservative therapy recommended and discussed. Patient will be dc home & is agreeable with above plan.  Final Clinical Impressions(s) / ED  Diagnoses   Final diagnoses:  Pain of left thumb    New Prescriptions New Prescriptions   No medications on file     Lorelle Gibbs 07/05/17 2335    Macarthur Critchley, MD 07/07/17 [phone]

## 2017-07-05 NOTE — Discharge Instructions (Signed)
Your exam is consistent with trigger finger. Wear brace until follow up with hand surgeon. Follow attached instructions. If you develop worsening or new concerning symptoms you can return to the emergency department for re-evaluation.

## 2017-07-05 NOTE — ED Triage Notes (Signed)
Patient states that he started to have pain to his left hand and thumb 3 days ago. The patient reports that when he moves it he can feel his thumb pop

## 2018-10-27 ENCOUNTER — Emergency Department (HOSPITAL_COMMUNITY)
Admission: EM | Admit: 2018-10-27 | Discharge: 2018-10-27 | Disposition: A | Discharge status: Home / Self Care | Payer: Commercial Managed Care - PPO | Attending: Emergency Medicine | Admitting: Emergency Medicine

## 2018-10-27 DIAGNOSIS — Z7982 Long term (current) use of aspirin: Secondary | ICD-10-CM | POA: Diagnosis not present

## 2018-10-27 DIAGNOSIS — Y929 Unspecified place or not applicable: Secondary | ICD-10-CM | POA: Insufficient documentation

## 2018-10-27 DIAGNOSIS — Z7984 Long term (current) use of oral hypoglycemic drugs: Secondary | ICD-10-CM | POA: Diagnosis not present

## 2018-10-27 DIAGNOSIS — Z79899 Other long term (current) drug therapy: Secondary | ICD-10-CM | POA: Diagnosis not present

## 2018-10-27 DIAGNOSIS — S0990XA Unspecified injury of head, initial encounter: Secondary | ICD-10-CM | POA: Diagnosis present

## 2018-10-27 DIAGNOSIS — I10 Essential (primary) hypertension: Secondary | ICD-10-CM | POA: Diagnosis not present

## 2018-10-27 DIAGNOSIS — Y990 Civilian activity done for income or pay: Secondary | ICD-10-CM | POA: Diagnosis not present

## 2018-10-27 DIAGNOSIS — E119 Type 2 diabetes mellitus without complications: Secondary | ICD-10-CM | POA: Insufficient documentation

## 2018-10-27 DIAGNOSIS — S0081XA Abrasion of other part of head, initial encounter: Secondary | ICD-10-CM | POA: Insufficient documentation

## 2018-10-27 DIAGNOSIS — W228XXA Striking against or struck by other objects, initial encounter: Secondary | ICD-10-CM | POA: Insufficient documentation

## 2018-10-27 DIAGNOSIS — Y9389 Activity, other specified: Secondary | ICD-10-CM | POA: Diagnosis not present

## 2018-10-27 MED ORDER — HYDROCHLOROTHIAZIDE 25 MG PO TABS: 25.0000 mg | ORAL_TABLET | Freq: Every day | ORAL | 0 refills | Status: DC

## 2018-10-27 NOTE — Discharge Instructions (Addendum)
Please have your blood pressure rechecked by physician this week

## 2018-10-27 NOTE — ED Provider Notes (Signed)
[facility] EMER[facility] DEPARTMENT Provider Note   CSN: [id number] Arrival date & time: 10/27/18  1653     History   Chief Complaint Chief Complaint  Patient presents with  . Headache    HPI Trevor Medina is a 48 y.o. male.  HPI  48 year old male presents today after striking his head on a piece of metal at work last night.  He states he was bent down and when he went to stand up he struck the left side of his forehead causing an abrasion.  He fell backwards but did not strike his head when he fell backwards.  He had no loss of consciousness.  He describes the pain as throbbing in nature directly over the abrasion.  He has not had any nausea, vomiting, lateralized weakness, syncope, or visual disturbance. States he has prediabetic.  He states he was on blood pressure medication in the past but stopped taking it as he did not like the physician that he was seeing.  Past Medical History:  Diagnosis Date  . Chronic back pain   . Diabetes mellitus without complication ([place])    no meds - was given Rx for Metformin in March 2016 at urgent care ([facility]) but never filled Rx. Planned to treat with "diet and exercise"  . History of gout   . HNP (herniated nucleus pulposus), lumbar   . Spinal stenosis     Patient Active Problem List   Diagnosis Date Noted  . Essential hypertension 09/14/2015  . HNP (herniated nucleus pulposus), lumbar 07/07/2015  . Uncontrolled diabetes mellitus ([place]) 07/07/2015  . Obesity 07/07/2015    Past Surgical History:  Procedure Laterality Date  . ANKLE SURGERY    . LUMBAR LAMINECTOMY/DECOMPRESSION MICRODISCECTOMY Bilateral 07/07/2015   Procedure: DECOMPRESSION L5-S1 BILATERALLY;  Surgeon: Susa Day, MD;  Location: [facility];  Service: Orthopedics;  Laterality: Bilateral;        Home Medications    Prior to Admission medications   Medication Sig Start Date End Date Taking? Authorizing Provider  albuterol (PROVENTIL  HFA;VENTOLIN HFA) 108 (90 Base) MCG/ACT inhaler Inhale 1-2 puffs into the lungs every 6 (six) hours as needed for wheezing or shortness of breath. 01/26/16   Harvel Quale, MD  Alcohol Swabs (ALCOHOL PADS) 70 % PADS 1 application by Does not apply route daily. Patient not taking: Reported on 09/13/2015 07/08/15   Dhungel, Flonnie Overman, MD  aspirin 81 MG tablet Take 81 mg by mouth daily.    [provider]  dapagliflozin propanediol (FARXIGA) 10 MG TABS tablet Take 10 mg by mouth daily. Take 5 mg for the first week, then the full dose thereafter. 09/13/15   Tereasa Coop, PA-C  docusate sodium (COLACE) 100 MG capsule Take 1 capsule (100 mg total) by mouth 2 (two) times daily as needed for mild constipation. Patient not taking: Reported on 09/13/2015 07/07/15   Susa Day, MD  gabapentin (NEURONTIN) 300 MG capsule Take 300 mg by mouth 3 (three) times daily.    [provider]  glimepiride (AMARYL) 2 MG tablet Take 1 tablet (2 mg total) by mouth daily with breakfast. Patient not taking: Reported on 09/13/2015 07/08/15   Dhungel, Flonnie Overman, MD  glucose monitoring kit (FREESTYLE) monitoring kit 1 each by Does not apply route 4 (four) times daily - after meals and at bedtime. 1 month Diabetic Testing Supplies for QAC-QHS accuchecks. Patient not taking: Reported on 09/13/2015 07/08/15   Dhungel, Flonnie Overman, MD  hydrochlorothiazide (HYDRODIURIL) 25 MG tablet Take 1  tablet (25 mg total) by mouth daily. 10/27/18   Pattricia Boss, MD  Lancets (ACCU-CHEK MULTICLIX) lancets daily Patient not taking: Reported on 09/13/2015 07/08/15   Dhungel, Flonnie Overman, MD  lisinopril (PRINIVIL,ZESTRIL) 5 MG tablet Take 1 tablet (5 mg total) by mouth daily. 09/13/15   Tereasa Coop, PA-C  meloxicam (MOBIC) 7.5 MG tablet Take 1 tablet (7.5 mg total) by mouth every morning. May resume 5 days post-op Patient not taking: Reported on 09/13/2015 07/08/15   Cecilie Kicks, PA-C  metFORMIN (GLUCOPHAGE) 1000 MG tablet Take  1 tablet (1,000 mg total) by mouth 2 (two) times daily with a meal. Start with 1 in the morning and night for the first week.  Then move to the full dose. 09/13/15   Tereasa Coop, PA-C  methocarbamol (ROBAXIN) 500 MG tablet Take 1 tablet (500 mg total) by mouth 3 (three) times daily. 07/07/15   Susa Day, MD  oxyCODONE-acetaminophen (PERCOCET) 5-325 MG tablet Take 1 tablet by mouth every 4 (four) hours as needed for moderate pain or severe pain. 01/26/16   Harvel Quale, MD  terbinafine (LAMISIL) 1 % cream Apply 1 application topically 2 (two) times daily. 09/13/15   Tereasa Coop, PA-C    Family History No family history on file.  Social History Social History   Tobacco Use  . Smoking status: Never Smoker  . Smokeless tobacco: Never Used  Substance Use Topics  . Alcohol use: Yes    Alcohol/week: 2.0 standard drinks    Types: 2 Standard drinks or equivalent per week    Comment: occasional  . Drug use: No     Allergies   Patient has no known allergies.   Review of Systems Review of Systems  All other systems reviewed and are negative.    Physical Exam Updated Vital Signs BP (!) 186/105   Pulse 72   Temp 97.7 F (36.5 C) (Oral)   Resp 16   SpO2 99%   Physical Exam Vitals signs and nursing note reviewed.  Constitutional:      Appearance: He is well-developed. He is obese. He is not ill-appearing.  HENT:     Head:     Comments: Abrasion left forehead    Mouth/Throat:     Mouth: Mucous membranes are moist.  Eyes:     General: No visual field deficit.    Extraocular Movements: Extraocular movements intact.     Pupils: Pupils are equal, round, and reactive to light.  Neck:     Musculoskeletal: Normal range of motion and neck supple.  Cardiovascular:     Rate and Rhythm: Normal rate and regular rhythm.  Pulmonary:     Effort: Pulmonary effort is normal.     Breath sounds: Normal breath sounds.  Neurological:     Mental Status: He is alert.      Cranial Nerves: No cranial nerve deficit, dysarthria or facial asymmetry.     Sensory: No sensory deficit.     Motor: No weakness.     Coordination: Romberg sign negative. Coordination normal.     Gait: Gait normal.     Deep Tendon Reflexes: Reflexes normal.  Psychiatric:        Mood and Affect: Mood normal.        Behavior: Behavior normal.      ED Treatments / Results  Labs (all labs ordered are listed, but only abnormal results are displayed) Labs Reviewed - No data to display  EKG None  Radiology No results  found.  Procedures Procedures (including critical care time)  Medications Ordered in ED Medications - No data to display   Initial Impression / Assessment and Plan / ED Course  I have reviewed the triage vital signs and the nursing notes.  Pertinent labs & imaging results that were available during my care of the patient were reviewed by me and considered in my medical decision making (see chart for details).     Patient with abrasion to forehead and headache here today after striking his head yesterday.  Headache developed at the time of the injury.  He did not lose consciousness, is not on blood thinners, and has a normal neurological exam did not think there is any indication for further imaging. Patient is also hypertensive.  I suspect that this is longstanding at the patient has a history of hypertension has not been taking his medications.  He also may have some superimposed hypertension given the situation states patient here in the ED.  He is started on hydrochlorothiazide and advised that he should have close follow-up this week and he voices understanding.  Final Clinical Impressions(s) / ED Diagnoses   Final diagnoses:  Abrasion of forehead, initial encounter  Hypertension, unspecified type    ED Discharge Orders         Ordered    hydrochlorothiazide (HYDRODIURIL) 25 MG tablet  Daily     10/27/18 1710           Pattricia Boss, MD 10/27/18  1715

## 2018-10-27 NOTE — ED Triage Notes (Signed)
Bumped head on a piece of metal last night, throbbing pain. Pain localized to site of injury. No numbness or tingling. No LOC

## 2018-10-29 ENCOUNTER — Encounter: Payer: Self-pay | Admitting: Nurse Practitioner

## 2018-10-29 ENCOUNTER — Ambulatory Visit (INDEPENDENT_AMBULATORY_CARE_PROVIDER_SITE_OTHER): Payer: Commercial Managed Care - PPO | Admitting: Nurse Practitioner

## 2018-10-29 ENCOUNTER — Other Ambulatory Visit: Payer: Commercial Managed Care - PPO

## 2018-10-29 VITALS — BP 164/90 | HR 75 | Temp 98.5°F | Ht 70.0 in | Wt 209.0 lb

## 2018-10-29 DIAGNOSIS — E119 Type 2 diabetes mellitus without complications: Secondary | ICD-10-CM

## 2018-10-29 DIAGNOSIS — M109 Gout, unspecified: Secondary | ICD-10-CM

## 2018-10-29 DIAGNOSIS — Z13220 Encounter for screening for lipoid disorders: Secondary | ICD-10-CM

## 2018-10-29 DIAGNOSIS — Z136 Encounter for screening for cardiovascular disorders: Secondary | ICD-10-CM | POA: Diagnosis not present

## 2018-10-29 DIAGNOSIS — I10 Essential (primary) hypertension: Secondary | ICD-10-CM | POA: Diagnosis not present

## 2018-10-29 LAB — LIPID PANEL
Cholesterol: 223 mg/dL — ABNORMAL HIGH (ref 0–200)
HDL: 40.9 mg/dL (ref >39.00)
LDL Cholesterol: 162 mg/dL — ABNORMAL HIGH (ref 0–99)
NonHDL: 181.61
TRIGLYCERIDES: 96 mg/dL (ref 0.0–149.0)
Total CHOL/HDL Ratio: 5
VLDL: 19.2 mg/dL (ref 0.0–40.0)

## 2018-10-29 LAB — HEMOGLOBIN A1C
EAG (MMOL/L): 16.2 (calc)
HEMOGLOBIN A1C: 11.8 %{Hb} — AB (ref <5.7)
Mean Plasma Glucose: 292 (calc)

## 2018-10-29 LAB — COMPREHENSIVE METABOLIC PANEL
ALT: 14 U/L (ref 0–53)
AST: 10 U/L (ref 0–37)
Albumin: 3.9 g/dL (ref 3.5–5.2)
Alkaline Phosphatase: 83 U/L (ref 39–117)
BUN: 11 mg/dL (ref 6–23)
CALCIUM: 9.5 mg/dL (ref 8.4–10.5)
CHLORIDE: 99 meq/L (ref 96–112)
CO2: 27 mEq/L (ref 19–32)
Creatinine, Ser: 0.76 mg/dL (ref 0.40–1.50)
GFR: 133.91 mL/min (ref >60.00)
Glucose, Bld: 248 mg/dL — ABNORMAL HIGH (ref 70–99)
POTASSIUM: 4.2 meq/L (ref 3.5–5.1)
SODIUM: 134 meq/L — AB (ref 135–145)
Total Bilirubin: 0.9 mg/dL (ref 0.2–1.2)
Total Protein: 7 g/dL (ref 6.0–8.3)

## 2018-10-29 LAB — CBC
HEMATOCRIT: 43.2 % (ref 39.0–52.0)
Hemoglobin: 14.4 g/dL (ref 13.0–17.0)
MCHC: 33.4 g/dL (ref 30.0–36.0)
MCV: 77.5 fl — AB (ref 78.0–100.0)
Platelets: 255 10*3/uL (ref 150.0–400.0)
RBC: 5.57 Mil/uL (ref 4.22–5.81)
RDW: 14.4 % (ref 11.5–15.5)
WBC: 4.3 10*3/uL (ref 4.0–10.5)

## 2018-10-29 LAB — MICROALBUMIN / CREATININE URINE RATIO
Creatinine,U: 117.7 mg/dL
MICROALB/CREAT RATIO: 39.3 mg/g — AB (ref 0.0–30.0)
Microalb, Ur: 46.2 mg/dL — ABNORMAL HIGH (ref 0.0–1.9)

## 2018-10-29 LAB — TSH: TSH: 1.56 u[IU]/mL (ref 0.35–4.50)

## 2018-10-29 LAB — URIC ACID: URIC ACID, SERUM: 7.2 mg/dL (ref 4.0–7.8)

## 2018-10-29 MED ORDER — AMLODIPINE BESYLATE 5 MG PO TABS: 5.0000 mg | ORAL_TABLET | Freq: Every day | ORAL | 1 refills | Status: DC

## 2018-10-29 NOTE — Progress Notes (Signed)
Subjective:  Patient ID: Trevor Medina, male    DOB: [birth year]  Age: 48 y.o. MRN: [id number]  CC: Establish Care (est care/ BP medications consult,got med from ED but havent take it due side effects. denied flu shot and tdap. )   Hypertension  This is a chronic problem. The current episode started more than 1 year ago. The problem is unchanged. Pertinent negatives include no anxiety, blurred vision, chest pain, headaches, malaise/fatigue, neck pain, orthopnea, palpitations, peripheral edema, PND or shortness of breath. There are no associated agents to hypertension. Risk factors for coronary artery disease include diabetes mellitus, male gender and stress. Past treatments include nothing. Compliance problems include medication side effects.  There is no history of a hypertension causing med.    Diagnosed with HTN and DM several years ago. Stopped taking medication due concern about plan of care.   reports hx of acute gout attack: Last episode 9monthago.  Reviewed past Medical, Social and Family history today.  Outpatient Medications Prior to Visit  Medication Sig Dispense Refill  . albuterol (PROVENTIL HFA;VENTOLIN HFA) 108 (90 Base) MCG/ACT inhaler Inhale 1-2 puffs into the lungs every 6 (six) hours as needed for wheezing or shortness of breath. (Patient not taking: Reported on 10/29/2018) 1 Inhaler 0  . Alcohol Swabs (ALCOHOL PADS) 70 % PADS 1 application by Does not apply route daily. (Patient not taking: Reported on 09/13/2015) 100 each 0  . aspirin 81 MG tablet Take 81 mg by mouth daily.    . dapagliflozin propanediol (FARXIGA) 10 MG TABS tablet Take 10 mg by mouth daily. Take 5 mg for the first week, then the full dose thereafter. (Patient not taking: Reported on 10/29/2018) 90 tablet 2  . docusate sodium (COLACE) 100 MG capsule Take 1 capsule (100 mg total) by mouth 2 (two) times daily as needed for mild constipation. (Patient not taking: Reported on 09/13/2015) 20 capsule 1  .  gabapentin (NEURONTIN) 300 MG capsule Take 300 mg by mouth 3 (three) times daily.    .Marland Kitchenglimepiride (AMARYL) 2 MG tablet Take 1 tablet (2 mg total) by mouth daily with breakfast. (Patient not taking: Reported on 09/13/2015) 30 tablet 0  . glucose monitoring kit (FREESTYLE) monitoring kit 1 each by Does not apply route 4 (four) times daily - after meals and at bedtime. 1 month Diabetic Testing Supplies for QAC-QHS accuchecks. (Patient not taking: Reported on 09/13/2015) 1 each 1  . Lancets (ACCU-CHEK MULTICLIX) lancets daily (Patient not taking: Reported on 09/13/2015) 100 each 12  . lisinopril (PRINIVIL,ZESTRIL) 5 MG tablet Take 1 tablet (5 mg total) by mouth daily. (Patient not taking: Reported on 10/29/2018) 90 tablet 3  . meloxicam (MOBIC) 7.5 MG tablet Take 1 tablet (7.5 mg total) by mouth every morning. May resume 5 days post-op (Patient not taking: Reported on 09/13/2015)    . methocarbamol (ROBAXIN) 500 MG tablet Take 1 tablet (500 mg total) by mouth 3 (three) times daily. (Patient not taking: Reported on 10/29/2018) 40 tablet 1  . oxyCODONE-acetaminophen (PERCOCET) 5-325 MG tablet Take 1 tablet by mouth every 4 (four) hours as needed for moderate pain or severe pain. (Patient not taking: Reported on 10/29/2018) 20 tablet 0  . terbinafine (LAMISIL) 1 % cream Apply 1 application topically 2 (two) times daily. (Patient not taking: Reported on 10/29/2018) 30 g 0  . hydrochlorothiazide (HYDRODIURIL) 25 MG tablet Take 1 tablet (25 mg total) by mouth daily. (Patient not taking: Reported on 10/29/2018) 30 tablet 0  . metFORMIN (  GLUCOPHAGE) 1000 MG tablet Take 1 tablet (1,000 mg total) by mouth 2 (two) times daily with a meal. Start with 1 in the morning and night for the first week.  Then move to the full dose. (Patient not taking: Reported on 10/29/2018) 180 tablet 3   No facility-administered medications prior to visit.     ROS See HPI  Objective:  BP (!) 164/90   Pulse 75   Temp 98.5 F (36.9 C)  (Oral)   Ht _0  (1.778 m)   Wt 209 lb (94.8 kg)   SpO2 96%   BMI 29.99 kg/m   BP Readings from Last 3 Encounters:  10/29/18 (!) 164/90  10/27/18 (!) 186/105  07/05/17 (!) 147/95    Wt Readings from Last 3 Encounters:  10/29/18 209 lb (94.8 kg)  07/05/17 219 lb (99.3 kg)  01/26/16 216 lb (98 kg)    Physical Exam Vitals signs reviewed.  Eyes:     Extraocular Movements: Extraocular movements intact.     Conjunctiva/sclera: Conjunctivae normal.     Pupils: Pupils are equal, round, and reactive to light.  Neck:     Musculoskeletal: Normal range of motion and neck supple.     Thyroid: No thyroid mass, thyromegaly or thyroid tenderness.  Cardiovascular:     Rate and Rhythm: Normal rate and regular rhythm.     Pulses: Normal pulses.     Heart sounds: Normal heart sounds.  Pulmonary:     Effort: Pulmonary effort is normal.     Breath sounds: Normal breath sounds.  Musculoskeletal:     Right lower leg: No edema.     Left lower leg: No edema.  Lymphadenopathy:     Cervical: No cervical adenopathy.  Neurological:     Mental Status: He is alert and oriented to person, place, and time.  Psychiatric:        Mood and Affect: Mood normal.        Behavior: Behavior normal.        Thought Content: Thought content normal.     Lab Results  Component Value Date   WBC 4.3 10/29/2018   HGB 14.4 10/29/2018   HCT 43.2 10/29/2018   PLT 255.0 10/29/2018   GLUCOSE 248 (H) 10/29/2018   CHOL 223 (H) 10/29/2018   TRIG 96.0 10/29/2018   HDL 40.90 10/29/2018   LDLCALC 162 (H) 10/29/2018   ALT 14 10/29/2018   AST 10 10/29/2018   NA 134 (L) 10/29/2018   K 4.2 10/29/2018   CL 99 10/29/2018   CREATININE 0.76 10/29/2018   BUN 11 10/29/2018   CO2 27 10/29/2018   TSH 1.56 10/29/2018   HGBA1C 11.8 (H) 10/29/2018   MICROALBUR 46.2 (H) 10/29/2018    Assessment & Plan:   [facility] was seen today for establish care.  Diagnoses and all orders for this visit:  Essential  hypertension -     CBC -     Comprehensive metabolic panel -     TSH -     amLODipine (NORVASC) 5 MG tablet; Take 1 tablet (5 mg total) by mouth at bedtime. -     hydrochlorothiazide (MICROZIDE) 12.5 MG capsule; Take 1 capsule (12.5 mg total) by mouth daily.  Type 2 diabetes mellitus without complication, without long-term current use of insulin (HCC) -     Comprehensive metabolic panel -     Hemoglobin A1c -     Microalbumin / creatinine urine ratio -     metFORMIN (GLUCOPHAGE-XR) 500 MG  24 hr tablet; Take 1 tablet (500 mg total) by mouth daily with breakfast. -     glipiZIDE (GLUCOTROL XL) 2.5 MG 24 hr tablet; Take 1 tablet (2.5 mg total) by mouth daily with breakfast.  Encounter for lipid screening for cardiovascular disease -     Lipid panel  Gouty arthritis -     Uric acid   I have discontinued Houston Asselin's metFORMIN and hydrochlorothiazide. I am also having him start on amLODipine, metFORMIN, glipiZIDE, and hydrochlorothiazide. Additionally, I am having him maintain his methocarbamol, docusate sodium, glucose monitoring kit, accu-chek multiclix, Alcohol Pads, glimepiride, meloxicam, aspirin, gabapentin, dapagliflozin propanediol, terbinafine, lisinopril, oxyCODONE-acetaminophen, and albuterol.  Meds ordered this encounter  Medications  . amLODipine (NORVASC) 5 MG tablet    Sig: Take 1 tablet (5 mg total) by mouth at bedtime.    Dispense:  30 tablet    Refill:  1    Order Specific Question:   Supervising Provider    Answer:   Clearance Coots E [[id number]]  . metFORMIN (GLUCOPHAGE-XR) 500 MG 24 hr tablet    Sig: Take 1 tablet (500 mg total) by mouth daily with breakfast.    Dispense:  60 tablet    Refill:  2    Order Specific Question:   Supervising Provider    Answer:   Lucille Passy [[id number]]  . glipiZIDE (GLUCOTROL XL) 2.5 MG 24 hr tablet    Sig: Take 1 tablet (2.5 mg total) by mouth daily with breakfast.    Dispense:  30 tablet    Refill:  2    Order Specific Question:    Supervising Provider    Answer:   Lucille Passy [[id number]]  . hydrochlorothiazide (MICROZIDE) 12.5 MG capsule    Sig: Take 1 capsule (12.5 mg total) by mouth daily.    Dispense:  30 capsule    Refill:  2    Order Specific Question:   Supervising Provider    Answer:   Lucille Passy [[id number]]    Problem List Items Addressed This Visit      Cardiovascular and Mediastinum   Essential hypertension - Primary   Relevant Medications   amLODipine (NORVASC) 5 MG tablet   hydrochlorothiazide (MICROZIDE) 12.5 MG capsule   Other Relevant Orders   CBC (Completed)   Comprehensive metabolic panel (Completed)   TSH (Completed)    Other Visit Diagnoses    Type 2 diabetes mellitus without complication, without long-term current use of insulin (HCC)       Relevant Medications   metFORMIN (GLUCOPHAGE-XR) 500 MG 24 hr tablet   glipiZIDE (GLUCOTROL XL) 2.5 MG 24 hr tablet   Other Relevant Orders   Comprehensive metabolic panel (Completed)   Hemoglobin A1c   Microalbumin / creatinine urine ratio (Completed)   Encounter for lipid screening for cardiovascular disease       Relevant Orders   Lipid panel (Completed)   Gouty arthritis       Relevant Orders   Uric acid (Completed)       Follow-up: Return in about 2 weeks (around 11/12/2018) for HTN.  Wilfred Lacy, NP

## 2018-10-29 NOTE — Patient Instructions (Addendum)
Uncontrolled DM with renal nephropathy. Need to start metformin and glipizide. Rx sent. Added HCTZ for BP control. rx sent Will re eval need to add lisinopril in 57month. Elevated total cholesterol and LDL. Need to maintain DASH diet and regular exercise to improve these numbers. LDL goal is <100 Normal uric acid and CBC. F/up in 2weeks as discussed  Check BP twice a week and record. Bring BP reading to next office visit.  Maintain DASH diet. Start amlodipine and HCTZ at bedtime.   DASH Eating Plan DASH stands for "Dietary Approaches to Stop Hypertension." The DASH eating plan is a healthy eating plan that has been shown to reduce high blood pressure (hypertension). It may also reduce your risk for type 2 diabetes, heart disease, and stroke. The DASH eating plan may also help with weight loss. What are tips for following this plan?  General guidelines  Avoid eating more than 2,300 mg (milligrams) of salt (sodium) a day. If you have hypertension, you may need to reduce your sodium intake to 1,500 mg a day.  Limit alcohol intake to no more than 1 drink a day for nonpregnant women and 2 drinks a day for men. One drink equals 12 oz of beer, 5 oz of wine, or 1 oz of hard liquor.  Work with your health care provider to maintain a healthy body weight or to lose weight. Ask what an ideal weight is for you.  Get at least 30 minutes of exercise that causes your heart to beat faster (aerobic exercise) most days of the week. Activities may include walking, swimming, or biking.  Work with your health care provider or diet and nutrition specialist (dietitian) to adjust your eating plan to your individual calorie needs. Reading food labels   Check food labels for the amount of sodium per serving. Choose foods with less than 5 percent of the Daily Value of sodium. Generally, foods with less than 300 mg of sodium per serving fit into this eating plan.  To find whole grains, look for the word "whole"  as the first word in the ingredient list. Shopping  Buy products labeled as "low-sodium" or "no salt added."  Buy fresh foods. Avoid canned foods and premade or frozen meals. Cooking  Avoid adding salt when cooking. Use salt-free seasonings or herbs instead of table salt or sea salt. Check with your health care provider or pharmacist before using salt substitutes.  Do not fry foods. Cook foods using healthy methods such as baking, boiling, grilling, and broiling instead.  Cook with heart-healthy oils, such as olive, canola, soybean, or sunflower oil. Meal planning  Eat a balanced diet that includes: ? 5 or more servings of fruits and vegetables each day. At each meal, try to fill half of your plate with fruits and vegetables. ? Up to 6-8 servings of whole grains each day. ? Less than 6 oz of lean meat, poultry, or fish each day. A 3-oz serving of meat is about the same size as a deck of cards. One egg equals 1 oz. ? 2 servings of low-fat dairy each day. ? A serving of nuts, seeds, or beans 5 times each week. ? Heart-healthy fats. Healthy fats called Omega-3 fatty acids are found in foods such as flaxseeds and coldwater fish, like sardines, salmon, and mackerel.  Limit how much you eat of the following: ? Canned or prepackaged foods. ? Food that is high in trans fat, such as fried foods. ? Food that is high in saturated fat,  such as fatty meat. ? Sweets, desserts, sugary drinks, and other foods with added sugar. ? Full-fat dairy products.  Do not salt foods before eating.  Try to eat at least 2 vegetarian meals each week.  Eat more home-cooked food and less restaurant, buffet, and fast food.  When eating at a restaurant, ask that your food be prepared with less salt or no salt, if possible. What foods are recommended? The items listed may not be a complete list. Talk with your dietitian about what dietary choices are best for you. Grains Whole-grain or whole-wheat bread.  Whole-grain or whole-wheat pasta. Brown rice. Orpah Cobbatmeal. Quinoa. Bulgur. Whole-grain and low-sodium cereals. Pita bread. Low-fat, low-sodium crackers. Whole-wheat flour tortillas. Vegetables Fresh or frozen vegetables (raw, steamed, roasted, or grilled). Low-sodium or reduced-sodium tomato and vegetable juice. Low-sodium or reduced-sodium tomato sauce and tomato paste. Low-sodium or reduced-sodium canned vegetables. Fruits All fresh, dried, or frozen fruit. Canned fruit in natural juice (without added sugar). Meat and other protein foods Skinless chicken or Mal[place]urkey. Ground chicken or [place]urkey. Pork with fat trimmed off. Fish and seafood. Egg whites. Dried beans, peas, or lentils. Unsalted nuts, nut butters, and seeds. Unsalted canned beans. Lean cuts of beef with fat trimmed off. Low-sodium, lean deli meat. Dairy Low-fat (1%) or fat-free (skim) milk. Fat-free, low-fat, or reduced-fat cheeses. Nonfat, low-sodium ricotta or cottage cheese. Low-fat or nonfat yogurt. Low-fat, low-sodium cheese. Fats and oils Soft margarine without trans fats. Vegetable oil. Low-fat, reduced-fat, or light mayonnaise and salad dressings (reduced-sodium). Canola, safflower, olive, soybean, and sunflower oils. Avocado. Seasoning and other foods Herbs. Spices. Seasoning mixes without salt. Unsalted popcorn and pretzels. Fat-free sweets. What foods are not recommended? The items listed may not be a complete list. Talk with your dietitian about what dietary choices are best for you. Grains Baked goods made with fat, such as croissants, muffins, or some breads. Dry pasta or rice meal packs. Vegetables Creamed or fried vegetables. Vegetables in a cheese sauce. Regular canned vegetables (not low-sodium or reduced-sodium). Regular canned tomato sauce and paste (not low-sodium or reduced-sodium). Regular tomato and vegetable juice (not low-sodium or reduced-sodium). Rosita FirePickles. Olives. Fruits Canned fruit in a light or heavy syrup.  Fried fruit. Fruit in cream or butter sauce. Meat and other protein foods Fatty cuts of meat. Ribs. Fried meat. Tomasa BlaseBacon. Sausage. Bologna and other processed lunch meats. Salami. Fatback. Hotdogs. Bratwurst. Salted nuts and seeds. Canned beans with added salt. Canned or smoked fish. Whole eggs or egg yolks. Chicken or [place]urkey with skin. Dairy Whole or 2% milk, cream, and half-and-half. Whole or full-fat cream cheese. Whole-fat or sweetened yogurt. Full-fat cheese. Nondairy creamers. Whipped toppings. Processed cheese and cheese spreads. Fats and oils Butter. Stick margarine. Lard. Shortening. Ghee. Bacon fat. Tropical oils, such as coconut, palm kernel, or palm oil. Seasoning and other foods Salted popcorn and pretzels. Onion salt, garlic salt, seasoned salt, table salt, and sea salt. Worcestershire sauce. Tartar sauce. Barbecue sauce. Teriyaki sauce. Soy sauce, including reduced-sodium. Steak sauce. Canned and packaged gravies. Fish sauce. Oyster sauce. Cocktail sauce. Horseradish that you find on the shelf. Ketchup. Mustard. Meat flavorings and tenderizers. Bouillon cubes. Hot sauce and Tabasco sauce. Premade or packaged marinades. Premade or packaged taco seasonings. Relishes. Regular salad dressings. Where to find more information:  National Heart, Lung, and Blood Institute: PopSteam.iswww.nhlbi.nih.gov  American Heart Association: www.heart.org Summary  The DASH eating plan is a healthy eating plan that has been shown to reduce high blood pressure (hypertension). It may also reduce your  risk for type 2 diabetes, heart disease, and stroke.  With the DASH eating plan, you should limit salt (sodium) intake to 2,300 mg a day. If you have hypertension, you may need to reduce your sodium intake to 1,500 mg a day.  When on the DASH eating plan, aim to eat more fresh fruits and vegetables, whole grains, lean proteins, low-fat dairy, and heart-healthy fats.  Work with your health care provider or diet and  nutrition specialist (dietitian) to adjust your eating plan to your individual calorie needs. This information is not intended to replace advice given to you by your health care provider. Make sure you discuss any questions you have with your health care provider. Document Released: 09/07/2011 Document Revised: 09/11/2016 Document Reviewed: 09/11/2016 Elsevier Interactive Patient Education  2019 [employer].

## 2018-10-30 ENCOUNTER — Encounter: Payer: Self-pay | Admitting: Nurse Practitioner

## 2018-10-30 ENCOUNTER — Telehealth: Payer: Self-pay

## 2018-10-30 DIAGNOSIS — E119 Type 2 diabetes mellitus without complications: Secondary | ICD-10-CM

## 2018-10-30 MED ORDER — GLIPIZIDE ER 2.5 MG PO TB24: 2.5000 mg | ORAL_TABLET | Freq: Every day | ORAL | 2 refills | Status: DC

## 2018-10-30 MED ORDER — METFORMIN HCL ER 500 MG PO TB24: 500.0000 mg | ORAL_TABLET | Freq: Two times a day (BID) | ORAL | 2 refills | Status: DC

## 2018-10-30 MED ORDER — HYDROCHLOROTHIAZIDE 12.5 MG PO CAPS: 12.5000 mg | ORAL_CAPSULE | Freq: Every day | ORAL | 2 refills | Status: DC

## 2018-10-30 MED ORDER — METFORMIN HCL ER 500 MG PO TB24: 500.0000 mg | ORAL_TABLET | Freq: Every day | ORAL | 2 refills | Status: DC

## 2018-10-30 NOTE — Telephone Encounter (Signed)
Copied from CRM [id number]. Topic: Quick Communication - Lab Results (Clinic Use ONLY) >> Oct 30, 2018  2:30 PM Leafy Ro wrote: Pt is calling and would like to know why he pick up  dm medication also. Pt is not aware of  blood work result

## 2018-10-30 NOTE — Addendum Note (Signed)
Addended byLivingston Diones on: 10/30/2018 03:58 PM   Modules accepted: Orders

## 2018-11-12 ENCOUNTER — Ambulatory Visit: Payer: Self-pay | Admitting: Nurse Practitioner

## 2018-11-15 ENCOUNTER — Encounter: Payer: Self-pay | Admitting: Nurse Practitioner

## 2018-11-15 ENCOUNTER — Ambulatory Visit (INDEPENDENT_AMBULATORY_CARE_PROVIDER_SITE_OTHER): Payer: Commercial Managed Care - PPO | Admitting: Nurse Practitioner

## 2018-11-15 VITALS — BP 130/70 | HR 93 | Temp 98.0°F | Ht 70.0 in | Wt 211.6 lb

## 2018-11-15 DIAGNOSIS — I10 Essential (primary) hypertension: Secondary | ICD-10-CM | POA: Diagnosis not present

## 2018-11-15 DIAGNOSIS — E119 Type 2 diabetes mellitus without complications: Secondary | ICD-10-CM

## 2018-11-15 LAB — BASIC METABOLIC PANEL
BUN: 19 mg/dL (ref 6–23)
CO2: 28 mEq/L (ref 19–32)
Calcium: 9.3 mg/dL (ref 8.4–10.5)
Chloride: 99 mEq/L (ref 96–112)
Creatinine, Ser: 1.01 mg/dL (ref 0.40–1.50)
GFR: 96.42 mL/min (ref >60.00)
GLUCOSE: 263 mg/dL — AB (ref 70–99)
POTASSIUM: 3.9 meq/L (ref 3.5–5.1)
SODIUM: 136 meq/L (ref 135–145)

## 2018-11-15 MED ORDER — BLOOD GLUCOSE MONITOR KIT: PACK | 0 refills | Status: DC

## 2018-11-15 NOTE — Progress Notes (Signed)
Subjective:  Patient ID: Trevor Medina, male    DOB: 1971/01/15  Age: 48 y.o. MRN: [id number]  CC: Follow-up (F/  for HTN/ pt states feels a lot better and been eating right )  HPI   HTN: BP at goal with amlodipine and HCTZ. Denies any adverse effects.'reports he has made changes to diet (low sodium) BP Readings from Last 3 Encounters:  11/15/18 130/70  10/29/18 (!) 164/90  10/27/18 (!) 186/105   DM: hgbA1c of 11 current use of metformin and glipizide with no adverse effects. Does not have glucometer. Does not have an ophthalmologist. No neuropathy.  Reviewed past Medical, Social and Family history today.  Outpatient Medications Prior to Visit  Medication Sig Dispense Refill  . amLODipine (NORVASC) 5 MG tablet Take 1 tablet (5 mg total) by mouth at bedtime. 30 tablet 1  . aspirin 81 MG tablet Take 81 mg by mouth daily.    Marland Kitchen glipiZIDE (GLUCOTROL XL) 2.5 MG 24 hr tablet Take 1 tablet (2.5 mg total) by mouth daily with breakfast. 30 tablet 2  . hydrochlorothiazide (MICROZIDE) 12.5 MG capsule Take 1 capsule (12.5 mg total) by mouth daily. 30 capsule 2  . metFORMIN (GLUCOPHAGE-XR) 500 MG 24 hr tablet Take 1 tablet (500 mg total) by mouth 2 (two) times daily after a meal. 60 tablet 2  . albuterol (PROVENTIL HFA;VENTOLIN HFA) 108 (90 Base) MCG/ACT inhaler Inhale 1-2 puffs into the lungs every 6 (six) hours as needed for wheezing or shortness of breath. (Patient not taking: Reported on 10/29/2018) 1 Inhaler 0  . Alcohol Swabs (ALCOHOL PADS) 70 % PADS 1 application by Does not apply route daily. (Patient not taking: Reported on 09/13/2015) 100 each 0  . dapagliflozin propanediol (FARXIGA) 10 MG TABS tablet Take 10 mg by mouth daily. Take 5 mg for the first week, then the full dose thereafter. (Patient not taking: Reported on 10/29/2018) 90 tablet 2  . docusate sodium (COLACE) 100 MG capsule Take 1 capsule (100 mg total) by mouth 2 (two) times daily as needed for mild constipation. (Patient  not taking: Reported on 09/13/2015) 20 capsule 1  . gabapentin (NEURONTIN) 300 MG capsule Take 300 mg by mouth 3 (three) times daily.    Marland Kitchen glimepiride (AMARYL) 2 MG tablet Take 1 tablet (2 mg total) by mouth daily with breakfast. (Patient not taking: Reported on 09/13/2015) 30 tablet 0  . glucose monitoring kit (FREESTYLE) monitoring kit 1 each by Does not apply route 4 (four) times daily - after meals and at bedtime. 1 month Diabetic Testing Supplies for QAC-QHS accuchecks. (Patient not taking: Reported on 09/13/2015) 1 each 1  . Lancets (ACCU-CHEK MULTICLIX) lancets daily (Patient not taking: Reported on 09/13/2015) 100 each 12  . lisinopril (PRINIVIL,ZESTRIL) 5 MG tablet Take 1 tablet (5 mg total) by mouth daily. (Patient not taking: Reported on 10/29/2018) 90 tablet 3  . meloxicam (MOBIC) 7.5 MG tablet Take 1 tablet (7.5 mg total) by mouth every morning. May resume 5 days post-op (Patient not taking: Reported on 09/13/2015)    . methocarbamol (ROBAXIN) 500 MG tablet Take 1 tablet (500 mg total) by mouth 3 (three) times daily. (Patient not taking: Reported on 10/29/2018) 40 tablet 1  . oxyCODONE-acetaminophen (PERCOCET) 5-325 MG tablet Take 1 tablet by mouth every 4 (four) hours as needed for moderate pain or severe pain. (Patient not taking: Reported on 10/29/2018) 20 tablet 0  . terbinafine (LAMISIL) 1 % cream Apply 1 application topically 2 (two) times daily. (Patient not  taking: Reported on 10/29/2018) 30 g 0   No facility-administered medications prior to visit.     ROS See HPI  Objective:  BP 130/70   Pulse 93   Temp 98 F (36.7 C) (Oral)   Ht 5' 10"  (1.778 m)   Wt 211 lb 9.6 oz (96 kg)   SpO2 97%   BMI 30.36 kg/m   BP Readings from Last 3 Encounters:  11/15/18 130/70  10/29/18 (!) 164/90  10/27/18 (!) 186/105    Wt Readings from Last 3 Encounters:  11/15/18 211 lb 9.6 oz (96 kg)  10/29/18 209 lb (94.8 kg)  07/05/17 219 lb (99.3 kg)    Physical Exam Vitals signs  reviewed.  Cardiovascular:     Rate and Rhythm: Normal rate and regular rhythm.     Pulses: Normal pulses.     Heart sounds: Normal heart sounds.  Pulmonary:     Effort: Pulmonary effort is normal.     Breath sounds: Normal breath sounds.  Musculoskeletal: Normal range of motion.  Skin:    Comments: Normal diabetic foot exam  Neurological:     Mental Status: He is alert and oriented to person, place, and time.  Psychiatric:        Mood and Affect: Mood normal.        Behavior: Behavior normal.     Lab Results  Component Value Date   WBC 4.3 10/29/2018   HGB 14.4 10/29/2018   HCT 43.2 10/29/2018   PLT 255.0 10/29/2018   GLUCOSE 263 (H) 11/15/2018   CHOL 223 (H) 10/29/2018   TRIG 96.0 10/29/2018   HDL 40.90 10/29/2018   LDLCALC 162 (H) 10/29/2018   ALT 14 10/29/2018   AST 10 10/29/2018   NA 136 11/15/2018   K 3.9 11/15/2018   CL 99 11/15/2018   CREATININE 1.01 11/15/2018   BUN 19 11/15/2018   CO2 28 11/15/2018   TSH 1.56 10/29/2018   HGBA1C 11.8 (H) 10/29/2018   MICROALBUR 46.2 (H) 10/29/2018     Assessment & Plan:   Jah was seen today for follow-up.  Diagnoses and all orders for this visit:  Type 2 diabetes mellitus without complication, without long-term current use of insulin ([place]) -     Ambulatory referral to Ophthalmology -     blood glucose meter kit and supplies KIT; Dispense based on patient and insurance preference. Use once a day before breakfast. E11.9 -     Basic metabolic panel -     glipiZIDE (GLUCOTROL XL) 2.5 MG 24 hr tablet; Take 1 tablet (2.5 mg total) by mouth daily with breakfast. -     metFORMIN (GLUCOPHAGE-XR) 500 MG 24 hr tablet; Take 1 tablet (500 mg total) by mouth 2 (two) times daily after a meal.  Essential hypertension -     Basic metabolic panel -     amLODipine (NORVASC) 5 MG tablet; Take 1 tablet (5 mg total) by mouth at bedtime. -     hydrochlorothiazide (MICROZIDE) 12.5 MG capsule; Take 1 capsule (12.5 mg total) by mouth  daily.   I have discontinued Kree Coletta's methocarbamol, docusate sodium, glucose monitoring kit, accu-chek multiclix, Alcohol Pads, glimepiride, meloxicam, aspirin, gabapentin, dapagliflozin propanediol, terbinafine, lisinopril, oxyCODONE-acetaminophen, and albuterol. I am also having him start on blood glucose meter kit and supplies. Additionally, I am having him maintain his amLODipine, glipiZIDE, hydrochlorothiazide, and metFORMIN.  Meds ordered this encounter  Medications  . blood glucose meter kit and supplies KIT    Sig: Dispense  based on patient and insurance preference. Use once a day before breakfast. E11.9    Dispense:  1 each    Refill:  0    Order Specific Question:   Supervising Provider    Answer:   MATTHEWS, CODY [[id number]]    Order Specific Question:   Number of strips    Answer:   100    Order Specific Question:   Number of lancets    Answer:   100  . amLODipine (NORVASC) 5 MG tablet    Sig: Take 1 tablet (5 mg total) by mouth at bedtime.    Dispense:  90 tablet    Refill:  3    Order Specific Question:   Supervising Provider    Answer:   MATTHEWS, CODY [[id number]]  . glipiZIDE (GLUCOTROL XL) 2.5 MG 24 hr tablet    Sig: Take 1 tablet (2.5 mg total) by mouth daily with breakfast.    Dispense:  90 tablet    Refill:  3    Order Specific Question:   Supervising Provider    Answer:   MATTHEWS, CODY [[id number]]  . hydrochlorothiazide (MICROZIDE) 12.5 MG capsule    Sig: Take 1 capsule (12.5 mg total) by mouth daily.    Dispense:  90 capsule    Refill:  1    Order Specific Question:   Supervising Provider    Answer:   MATTHEWS, CODY [[id number]]  . metFORMIN (GLUCOPHAGE-XR) 500 MG 24 hr tablet    Sig: Take 1 tablet (500 mg total) by mouth 2 (two) times daily after a meal.    Dispense:  180 tablet    Refill:  1    Please cancel previous rx (incorrect direction)    Order Specific Question:   Supervising Provider    Answer:   Zigmund Daniel, CODY [[id number]]    Problem List Items Addressed  This Visit      Cardiovascular and Mediastinum   Essential hypertension    BP at goal with amlodipine and HCTZ. Repeat BMP today to re eval hyponatremia.  BP Readings from Last 3 Encounters:  11/15/18 130/70  10/29/18 (!) 164/90  10/27/18 (!) 186/105        Relevant Medications   amLODipine (NORVASC) 5 MG tablet   hydrochlorothiazide (MICROZIDE) 12.5 MG capsule   Other Relevant Orders   Basic metabolic panel (Completed)    Other Visit Diagnoses    Type 2 diabetes mellitus without complication, without long-term current use of insulin ([facility])    -  Primary   Relevant Medications   blood glucose meter kit and supplies KIT   glipiZIDE (GLUCOTROL XL) 2.5 MG 24 hr tablet   metFORMIN (GLUCOPHAGE-XR) 500 MG 24 hr tablet   Other Relevant Orders   Ambulatory referral to Ophthalmology   Basic metabolic panel (Completed)       Follow-up: Return in about 2 months (around 01/14/2019) for DM and HTN.  Wilfred Lacy, NP

## 2018-11-15 NOTE — Patient Instructions (Addendum)
Continue current medications  Check glucose once a day before breakfast. Bring glucose readings to next office visit. Implement hypoglycemia protocol if glucose <90. Call office If experiencing hypoglycemic episodes daily. Call office if glucose >250 x 1week.  You will be contacted to schedule appt with ophthalmology.  Go to lab for blood draw.   Hypoglycemia Hypoglycemia occurs when the level of sugar (glucose) in the blood is too low. Hypoglycemia can happen in people who do or do not have diabetes. It can develop quickly, and it can be a medical emergency. For most people with diabetes, a blood glucose level below 70 mg/dL (3.9 mmol/L) is considered hypoglycemia. Glucose is a type of sugar that provides the body's main source of energy. Certain hormones (insulin and glucagon) control the level of glucose in the blood. Insulin lowers blood glucose, and glucagon raises blood glucose. Hypoglycemia can result from having too much insulin in the bloodstream, or from not eating enough food that contains glucose. You may also have reactive hypoglycemia, which happens within 4 hours after eating a meal. What are the causes? Hypoglycemia occurs most often in people who have diabetes and may be caused by:  Diabetes medicine.  Not eating enough, or not eating often enough.  Increased physical activity.  Drinking alcohol on an empty stomach. If you do not have diabetes, hypoglycemia may be caused by:  A tumor in the pancreas.  Not eating enough, or not eating for long periods at a time (fasting).  A severe infection or illness.  Certain medicines. What increases the risk? Hypoglycemia is more likely to develop in:  People who have diabetes and take medicines to lower blood glucose.  People who abuse alcohol.  People who have a severe illness. What are the signs or symptoms? Mild symptoms Mild hypoglycemia may not cause any symptoms. If you do have symptoms, they may  include:  Hunger.  Anxiety.  Sweating and feeling clammy.  Dizziness or feeling light-headed.  Sleepiness.  Nausea.  Increased heart rate.  Headache.  Blurry vision.  Irritability.  Tingling or numbness around the mouth, lips, or tongue.  A change in coordination.  Restless sleep. Moderate symptoms Moderate hypoglycemia can cause:  Mental confusion and poor judgment.  Behavior changes.  Weakness.  Irregular heartbeat. Severe symptoms Severe hypoglycemia is a medical emergency. It can cause:  Fainting.  Seizures.  Loss of consciousness (coma).  Death. How is this diagnosed? Hypoglycemia is diagnosed with a blood test to measure your blood glucose level. This blood test is done while you are having symptoms. Your health care provider may also do a physical exam and review your medical history. How is this treated? This condition can often be treated by immediately eating or drinking something that contains sugar, such as:  Fruit juice, 4-6 oz (120-150 mL).  Regular soda (not diet soda), 4-6 oz (120-150 mL).  Low-fat milk, 4 oz (120 mL).  Several pieces of hard candy.  Sugar or honey, 1 Tbsp (15 mL). Treating hypoglycemia if you have diabetes If you are alert and able to swallow safely, follow the 15:15 rule:  Take 15 grams of a rapid-acting carbohydrate. Talk with your health care provider about how much you should take.  Rapid-acting options include: ? Glucose pills (take 15 grams). ? 6-8 pieces of hard candy. ? 4-6 oz (120-150 mL) of fruit juice. ? 4-6 oz (120-150 mL) of regular (not diet) soda. ? 1 Tbsp (15 mL) honey or sugar.  Check your blood glucose 15  minutes after you take the carbohydrate.  If the repeat blood glucose level is still at or below 70 mg/dL (3.9 mmol/L), take 15 grams of a carbohydrate again.  If your blood glucose level does not increase above 70 mg/dL (3.9 mmol/L) after 3 tries, seek emergency medical care.  After  your blood glucose level returns to normal, eat a meal or a snack within 1 hour.  Treating severe hypoglycemia Severe hypoglycemia is when your blood glucose level is at or below 54 mg/dL (3 mmol/L). Severe hypoglycemia is a medical emergency. Get medical help right away. If you have severe hypoglycemia and you cannot eat or drink, you may need an injection of glucagon. A family member or close friend should learn how to check your blood glucose and how to give you a glucagon injection. Ask your health care provider if you need to have an emergency glucagon injection kit available. Severe hypoglycemia may need to be treated in a hospital. The treatment may include getting glucose through an IV. You may also need treatment for the cause of your hypoglycemia. Follow these instructions at home:  General instructions  Take over-the-counter and prescription medicines only as told by your health care provider.  Monitor your blood glucose as told by your health care provider.  Limit alcohol intake to no more than 1 drink a day for nonpregnant women and 2 drinks a day for men. One drink equals 12 oz of beer (355 mL), 5 oz of wine (148 mL), or 1 oz of hard liquor (44 mL).  Keep all follow-up visits as told by your health care provider. This is important. If you have diabetes:  Always have a rapid-acting carbohydrate snack with you to treat low blood glucose.  Follow your diabetes management plan as directed. Make sure you: ? Know the symptoms of hypoglycemia. It is important to treat it right away to prevent it from becoming severe. ? Take your medicines as directed. ? Follow your exercise plan. ? Follow your meal plan. Eat on time, and do not skip meals. ? Check your blood glucose as often as directed. Always check before and after exercise. ? Follow your sick day plan whenever you cannot eat or drink normally. Make this plan in advance with your health care provider.  Share your diabetes  management plan with people in your workplace, school, and household.  Check your urine for ketones when you are ill and as told by your health care provider.  Carry a medical alert card or wear medical alert jewelry. Contact a health care provider if:  You have problems keeping your blood glucose in your target range.  You have frequent episodes of hypoglycemia. Get help right away if:  You continue to have hypoglycemia symptoms after eating or drinking something containing glucose.  Your blood glucose is at or below 54 mg/dL (3 mmol/L).  You have a seizure.  You faint. These symptoms may represent a serious problem that is an emergency. Do not wait to see if the symptoms will go away. Get medical help right away. Call your local emergency services (911 in the [place] Summary  Hypoglycemia occurs when the level of sugar (glucose) in the blood is too low.  Hypoglycemia can happen in people who do or do not have diabetes. It can develop quickly, and it can be a medical emergency.  Make sure you know the symptoms of hypoglycemia and how to treat it.  Always have a rapid-acting carbohydrate snack with you to treat  low blood sugar. This information is not intended to replace advice given to you by your health care provider. Make sure you discuss any questions you have with your health care provider. Document Released: 09/18/2005 Document Revised: 03/12/2018 Document Reviewed: 10/22/2015 Elsevier Interactive Patient Education  2019 [employer].

## 2018-11-15 NOTE — Assessment & Plan Note (Signed)
hgbA1c of 11 Metformin and glipizide started 2weeks ago. No adverse effects. No neuropathy Entered referral to opthalmology

## 2018-11-15 NOTE — Assessment & Plan Note (Signed)
BP at goal with amlodipine and HCTZ. Repeat BMP today to re eval hyponatremia.  BP Readings from Last 3 Encounters:  11/15/18 130/70  10/29/18 (!) 164/90  10/27/18 (!) 186/105

## 2018-11-16 MED ORDER — GLIPIZIDE ER 2.5 MG PO TB24: 2.5000 mg | ORAL_TABLET | Freq: Every day | ORAL | 3 refills | Status: DC

## 2018-11-16 MED ORDER — METFORMIN HCL ER 500 MG PO TB24: 500.0000 mg | ORAL_TABLET | Freq: Two times a day (BID) | ORAL | 1 refills | Status: DC

## 2018-11-16 MED ORDER — AMLODIPINE BESYLATE 5 MG PO TABS: 5.0000 mg | ORAL_TABLET | Freq: Every day | ORAL | 3 refills | Status: DC

## 2018-11-16 MED ORDER — HYDROCHLOROTHIAZIDE 12.5 MG PO CAPS: 12.5000 mg | ORAL_CAPSULE | Freq: Every day | ORAL | 1 refills | Status: DC

## 2019-01-14 ENCOUNTER — Ambulatory Visit: Payer: Self-pay | Admitting: Nurse Practitioner

## 2019-02-04 ENCOUNTER — Telehealth: Payer: Self-pay | Admitting: Nurse Practitioner

## 2019-02-04 NOTE — Telephone Encounter (Signed)
Called and left vm for patient. Calling to schedule virtual visit with [place].  °

## 2020-01-19 ENCOUNTER — Other Ambulatory Visit: Payer: Self-pay | Admitting: Nurse Practitioner

## 2020-01-19 DIAGNOSIS — E119 Type 2 diabetes mellitus without complications: Secondary | ICD-10-CM

## 2020-06-22 ENCOUNTER — Emergency Department (HOSPITAL_COMMUNITY)
Admission: EM | Admit: 2020-06-22 | Discharge: 2020-06-23 | Disposition: A | Discharge status: Home / Self Care | Payer: No Typology Code available for payment source | Attending: Emergency Medicine | Admitting: Emergency Medicine

## 2020-06-22 ENCOUNTER — Encounter (HOSPITAL_COMMUNITY): Payer: Self-pay | Admitting: *Deleted

## 2020-06-22 ENCOUNTER — Other Ambulatory Visit: Payer: Self-pay

## 2020-06-22 DIAGNOSIS — Z23 Encounter for immunization: Secondary | ICD-10-CM | POA: Diagnosis not present

## 2020-06-22 DIAGNOSIS — Z79899 Other long term (current) drug therapy: Secondary | ICD-10-CM | POA: Insufficient documentation

## 2020-06-22 DIAGNOSIS — I10 Essential (primary) hypertension: Secondary | ICD-10-CM | POA: Diagnosis not present

## 2020-06-22 DIAGNOSIS — Z7984 Long term (current) use of oral hypoglycemic drugs: Secondary | ICD-10-CM | POA: Diagnosis not present

## 2020-06-22 DIAGNOSIS — E119 Type 2 diabetes mellitus without complications: Secondary | ICD-10-CM | POA: Diagnosis not present

## 2020-06-22 DIAGNOSIS — Z4800 Encounter for change or removal of nonsurgical wound dressing: Secondary | ICD-10-CM | POA: Insufficient documentation

## 2020-06-22 DIAGNOSIS — L03116 Cellulitis of left lower limb: Secondary | ICD-10-CM | POA: Diagnosis not present

## 2020-06-22 DIAGNOSIS — Z5189 Encounter for other specified aftercare: Secondary | ICD-10-CM

## 2020-06-22 NOTE — ED Triage Notes (Signed)
Pt reports laceration to left lower leg approx 3 days ago. Hit cut by piece of metal in a yard, unsure of last tetanus. Pt has been treating at home but has redness and drainage. Pt is diabetic.

## 2020-06-23 LAB — CBC
HCT: 38.6 % — ABNORMAL LOW (ref 39.0–52.0)
Hemoglobin: 12.5 g/dL — ABNORMAL LOW (ref 13.0–17.0)
MCH: 26 pg (ref 26.0–34.0)
MCHC: 32.4 g/dL (ref 30.0–36.0)
MCV: 80.2 fL (ref 80.0–100.0)
Platelets: 258 10*3/uL (ref 150–400)
RBC: 4.81 MIL/uL (ref 4.22–5.81)
RDW: 14 % (ref 11.5–15.5)
WBC: 6.8 10*3/uL (ref 4.0–10.5)
nRBC: 0 % (ref 0.0–0.2)

## 2020-06-23 LAB — BASIC METABOLIC PANEL
Anion gap: 10 (ref 5–15)
BUN: 7 mg/dL (ref 6–20)
CO2: 26 mmol/L (ref 22–32)
Calcium: 9.2 mg/dL (ref 8.9–10.3)
Chloride: 99 mmol/L (ref 98–111)
Creatinine, Ser: 0.84 mg/dL (ref 0.61–1.24)
GFR calc Af Amer: >60 mL/min (ref >60)
GFR calc non Af Amer: >60 mL/min (ref >60)
Glucose, Bld: 324 mg/dL — ABNORMAL HIGH (ref 70–99)
Potassium: 4.5 mmol/L (ref 3.5–5.1)
Sodium: 135 mmol/L (ref 135–145)

## 2020-06-23 LAB — LACTIC ACID, PLASMA: Lactic Acid, Venous: 2.4 mmol/L (ref 0.5–1.9)

## 2020-06-23 MED ORDER — CEPHALEXIN 500 MG PO CAPS
500.0000 mg | ORAL_CAPSULE | Freq: Four times a day (QID) | ORAL | 0 refills | Status: DC
Start: 2020-06-23 — End: 2023-02-11

## 2020-06-23 MED ORDER — TETANUS-DIPHTH-ACELL PERTUSSIS 5-2.5-18.5 LF-MCG/0.5 IM SUSP
0.5000 mL | Freq: Once | INTRAMUSCULAR | Status: AC
  Administered 2020-06-23: 0.5 mL via INTRAMUSCULAR
  Filled 2020-06-23: qty 0.5

## 2020-06-23 NOTE — ED Notes (Signed)
Date and time results received: 06/23/20 0224   Test: lactic acid Critical Value: 2.4  Name of Provider Notified: Dr. Preston Fleeting

## 2020-06-23 NOTE — Discharge Instructions (Addendum)
You were seen today for a wound check.  Your wound is slightly red which could indicate infection but it could also be a local reaction to Neosporin.  Discontinue using Neosporin.  Monitor for worsening redness.  Take antibiotics as prescribed.

## 2020-06-23 NOTE — ED Provider Notes (Signed)
[facility] EMERGENCY DEPARTMENT Provider Note   CSN: [id number] Arrival date & time: 06/22/20  1902     History Chief Complaint  Patient presents with  . Wound Check    Trevor Medina is a 49 y.o. male.  HPI     This a 49 year old male with history of diabetes who presents with concern for wound infection.  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  He was walking through a woman's yard when a piece of metal scratched his left lower leg.  He denies any significant pain.  He used A&E ointment and Neosporin and has noted some increased redness.  He has not noted any drainage.  He is diabetic and is concerned for infection.  Has not had any fevers or systemic symptoms.  Unknown last tetanus shot.  Past Medical History:  Diagnosis Date  . Chronic back pain   . Diabetes mellitus without complication ([place])    no meds - was given Rx for Metformin in March 2016 at urgent care ([facility]) but never filled Rx. Planned to treat with "diet and exercise"  . History of gout   . HNP (herniated nucleus pulposus), lumbar   . Spinal stenosis     Patient Active Problem List   Diagnosis Date Noted  . Essential hypertension 09/14/2015  . HNP (herniated nucleus pulposus), lumbar 07/07/2015  . Uncontrolled diabetes mellitus ([place]) 07/07/2015  . Obesity 07/07/2015    Past Surgical History:  Procedure Laterality Date  . ANKLE SURGERY    . LUMBAR LAMINECTOMY/DECOMPRESSION MICRODISCECTOMY Bilateral 07/07/2015   Procedure: DECOMPRESSION L5-S1 BILATERALLY;  Surgeon: Susa Day, MD;  Location: [facility];  Service: Orthopedics;  Laterality: Bilateral;       Family History  Problem Relation Age of Onset  . Cancer Mother 21       ovarian cancer  . Diabetes Father     Social History   Tobacco Use  . Smoking status: Never Smoker  . Smokeless tobacco: Never Used  Vaping Use  . Vaping Use: Never used  Substance Use Topics  . Alcohol use: Yes    Alcohol/week: 2.0  standard drinks    Types: 1 Cans of beer, 1 Shots of liquor per week    Comment: on weekend  . Drug use: No    Home Medications Prior to Admission medications   Medication Sig Start Date End Date Taking? Authorizing Provider  amLODipine (NORVASC) 5 MG tablet Take 1 tablet (5 mg total) by mouth at bedtime. 11/16/18   Nche, Charlene Brooke, NP  blood glucose meter kit and supplies KIT Dispense based on patient and insurance preference. Use once a day before breakfast. E11.9 11/15/18   Nche, Charlene Brooke, NP  cephALEXin (KEFLEX) 500 MG capsule Take 1 capsule (500 mg total) by mouth 4 (four) times daily. 06/23/20   Ryheem Jay, Barbette Hair, MD  glipiZIDE (GLUCOTROL XL) 2.5 MG 24 hr tablet Take 1 tablet (2.5 mg total) by mouth daily with breakfast. 11/16/18   Nche, Charlene Brooke, NP  hydrochlorothiazide (MICROZIDE) 12.5 MG capsule Take 1 capsule (12.5 mg total) by mouth daily. 11/16/18   Nche, Charlene Brooke, NP  metFORMIN (GLUCOPHAGE-XR) 500 MG 24 hr tablet Take 1 tablet (500 mg total) by mouth 2 (two) times daily after a meal. 11/16/18   Nche, Charlene Brooke, NP    Allergies    Patient has no known allergies.  Review of Systems   Review of Systems  Constitutional: Negative for fever.  Skin: Positive for color  change and wound.  All other systems reviewed and are negative.   Physical Exam Updated Vital Signs BP (!) 175/97   Pulse 75   Temp 98.4 F (36.9 C) (Oral)   Resp (!) 22   Ht 1.778 m (5' 10" )   Wt 98 kg   SpO2 99%   BMI 30.99 kg/m   Physical Exam Vitals and nursing note reviewed.  Constitutional:      Appearance: He is well-developed. He is obese. He is not ill-appearing.  HENT:     Head: Normocephalic and atraumatic.     Mouth/Throat:     Mouth: Mucous membranes are moist.  Eyes:     Pupils: Pupils are equal, round, and reactive to light.  Cardiovascular:     Rate and Rhythm: Normal rate and regular rhythm.  Pulmonary:     Effort: Pulmonary effort is normal. No respiratory  distress.  Musculoskeletal:        General: No tenderness or deformity.     Cervical back: Neck supple.  Lymphadenopathy:     Cervical: No cervical adenopathy.  Skin:    General: Skin is warm and dry.     Comments: Deep abrasion to the left lower extremity over the distal aspect of the shin, adjacent erythema, no significant warmth or drainage noted  Neurological:     Mental Status: He is alert and oriented to person, place, and time.  Psychiatric:        Mood and Affect: Mood normal.       ED Results / Procedures / Treatments   Labs (all labs ordered are listed, but only abnormal results are displayed) Labs Reviewed  CBC - Abnormal; Notable for the following components:      Result Value   Hemoglobin 12.5 (*)    HCT 38.6 (*)    All other components within normal limits  BASIC METABOLIC PANEL - Abnormal; Notable for the following components:   Glucose, Bld 324 (*)    All other components within normal limits  LACTIC ACID, PLASMA - Abnormal; Notable for the following components:   Lactic Acid, Venous 2.4 (*)    All other components within normal limits  LACTIC ACID, PLASMA    EKG None  Radiology No results found.  Procedures Procedures (including critical care time)  Medications Ordered in ED Medications  Tdap (BOOSTRIX) injection 0.5 mL (has no administration in time range)    ED Course  I have reviewed the triage vital signs and the nursing notes.  Pertinent labs & imaging results that were available during my care of the patient were reviewed by me and considered in my medical decision making (see chart for details).    MDM Rules/Calculators/A&P                           Patient presents for wound check.  Concern for possible infection.  He is overall nontoxic and vital signs notable for blood pressure 175/97.  Patient denies significant pain or fevers.  On exam, he may have some early cellulitis.  There is some erythema and yellowing within the wound.   However, no significant pain.  Vital signs without fever.  Denies systemic symptoms.  He has no significant white count.  Could be early cellulitis versus local reaction to Neosporin and neomycin.  Regardless, will update tetanus and provide with a short course of antibiotics.  No indication for closure.  After history, exam, and medical workup I feel  the patient has been appropriately medically screened and is safe for discharge home. Pertinent diagnoses were discussed with the patient. Patient was given return precautions.   Final Clinical Impression(s) / ED Diagnoses Final diagnoses:  Visit for wound check  Cellulitis of left lower extremity    Rx / DC Orders ED Discharge Orders         Ordered    cephALEXin (KEFLEX) 500 MG capsule  4 times daily        06/23/20 0323           Amariah Kierstead, Barbette Hair, MD 06/23/20 [phone]

## 2020-10-20 ENCOUNTER — Encounter (HOSPITAL_COMMUNITY): Payer: Self-pay

## 2020-10-20 ENCOUNTER — Other Ambulatory Visit: Payer: Self-pay

## 2020-10-20 ENCOUNTER — Emergency Department (HOSPITAL_COMMUNITY)
Admission: EM | Admit: 2020-10-20 | Discharge: 2020-10-20 | Disposition: A | Discharge status: Home / Self Care | Payer: No Typology Code available for payment source | Attending: Emergency Medicine | Admitting: Emergency Medicine

## 2020-10-20 DIAGNOSIS — Z79899 Other long term (current) drug therapy: Secondary | ICD-10-CM | POA: Diagnosis not present

## 2020-10-20 DIAGNOSIS — E119 Type 2 diabetes mellitus without complications: Secondary | ICD-10-CM | POA: Insufficient documentation

## 2020-10-20 DIAGNOSIS — Z7984 Long term (current) use of oral hypoglycemic drugs: Secondary | ICD-10-CM | POA: Insufficient documentation

## 2020-10-20 DIAGNOSIS — Y92410 Unspecified street and highway as the place of occurrence of the external cause: Secondary | ICD-10-CM | POA: Diagnosis not present

## 2020-10-20 DIAGNOSIS — M542 Cervicalgia: Secondary | ICD-10-CM | POA: Insufficient documentation

## 2020-10-20 DIAGNOSIS — I10 Essential (primary) hypertension: Secondary | ICD-10-CM | POA: Insufficient documentation

## 2020-10-20 MED ORDER — NAPROXEN 375 MG PO TABS: 375.0000 mg | ORAL_TABLET | Freq: Two times a day (BID) | ORAL | 0 refills | Status: DC

## 2020-10-20 MED ORDER — LIDOCAINE 5 % EX PTCH: 1.0000 | MEDICATED_PATCH | CUTANEOUS | 0 refills | Status: DC

## 2020-10-20 MED ORDER — CYCLOBENZAPRINE HCL 10 MG PO TABS: 10.0000 mg | ORAL_TABLET | Freq: Two times a day (BID) | ORAL | 0 refills | Status: DC | PRN
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
## 2021-08-07 ENCOUNTER — Emergency Department (HOSPITAL_BASED_OUTPATIENT_CLINIC_OR_DEPARTMENT_OTHER): Payer: Managed Care, Other (non HMO)

## 2021-08-07 ENCOUNTER — Encounter (HOSPITAL_BASED_OUTPATIENT_CLINIC_OR_DEPARTMENT_OTHER): Payer: Self-pay | Admitting: Urology

## 2021-08-07 DIAGNOSIS — Z79899 Other long term (current) drug therapy: Secondary | ICD-10-CM | POA: Insufficient documentation

## 2021-08-07 DIAGNOSIS — Z7984 Long term (current) use of oral hypoglycemic drugs: Secondary | ICD-10-CM | POA: Diagnosis not present

## 2021-08-07 DIAGNOSIS — E119 Type 2 diabetes mellitus without complications: Secondary | ICD-10-CM | POA: Diagnosis not present

## 2021-08-07 DIAGNOSIS — I10 Essential (primary) hypertension: Secondary | ICD-10-CM | POA: Insufficient documentation

## 2021-08-07 DIAGNOSIS — M25512 Pain in left shoulder: Secondary | ICD-10-CM | POA: Diagnosis present

## 2021-08-07 NOTE — ED Triage Notes (Signed)
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  Pain worse lifting, limited ROM

## 2021-08-08 ENCOUNTER — Emergency Department (HOSPITAL_BASED_OUTPATIENT_CLINIC_OR_DEPARTMENT_OTHER)
Admission: EM | Admit: 2021-08-08 | Discharge: 2021-08-08 | Disposition: A | Discharge status: Home / Self Care | Payer: Managed Care, Other (non HMO) | Attending: Emergency Medicine | Admitting: Emergency Medicine

## 2021-08-08 DIAGNOSIS — M25512 Pain in left shoulder: Secondary | ICD-10-CM

## 2021-08-08 MED ORDER — NAPROXEN 375 MG PO TABS: 375.0000 mg | ORAL_TABLET | Freq: Two times a day (BID) | ORAL | 0 refills | Status: DC | PRN

## 2021-08-08 NOTE — ED Provider Notes (Signed)
[facility] EMERGENCY DEPARTMENT Provider Note   CSN: [id number] Arrival date & time: 08/07/21  2133     History Chief Complaint  Patient presents with   Shoulder Pain    Trevor Medina is a 50 y.o. male.  The history is provided by the patient.  Shoulder Pain Trevor Medina is a 50 y.o. male who presents to the Emergency Department complaining of shoulder pain.  He presents to the ED for evaluation of left shoulder pain.  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  About one week ago he noticed pain to the left anterior shoulder when he fully abducts the shoulder.  Pain radiates and radiates to the axilla and deltoid.  No hand numbness/weakness.  He is right hand dominant.    No fevers, chills, chest pain, sob.    Has a hx/o DM, HPL    Past Medical History:  Diagnosis Date   Chronic back pain    Diabetes mellitus without complication ([place])    no meds - was given Rx for Metformin in March 2016 at urgent care ([facility]) but never filled Rx. Planned to treat with "diet and exercise"   History of gout    HNP (herniated nucleus pulposus), lumbar    Spinal stenosis     Patient Active Problem List   Diagnosis Date Noted   Essential hypertension 09/14/2015   HNP (herniated nucleus pulposus), lumbar 07/07/2015   Uncontrolled diabetes mellitus ([place]) 07/07/2015   Obesity 07/07/2015    Past Surgical History:  Procedure Laterality Date   ANKLE SURGERY     LUMBAR LAMINECTOMY/DECOMPRESSION MICRODISCECTOMY Bilateral 07/07/2015   Procedure: DECOMPRESSION L5-S1 BILATERALLY;  Surgeon: Susa Day, MD;  Location: [facility] OR[facility];  Service: Orthopedics;  Laterality: Bilateral;       Family History  Problem Relation Age of Onset   Cancer Mother 85       ovarian cancer   Diabetes Father     Social History   Tobacco Use   Smoking status: Never   Smokeless tobacco: Never  Vaping Use   Vaping Use: Never used  Substance Use Topics   Alcohol  use: Yes    Alcohol/week: 2.0 standard drinks    Types: 1 Cans of beer, 1 Shots of liquor per week    Comment: on weekend   Drug use: No    Home Medications Prior to Admission medications   Medication Sig Start Date End Date Taking? Authorizing Provider  amLODipine (NORVASC) 5 MG tablet Take 1 tablet (5 mg total) by mouth at bedtime. 11/16/18   Nche, Charlene Brooke, NP  blood glucose meter kit and supplies KIT Dispense based on patient and insurance preference. Use once a day before breakfast. E11.9 11/15/18   Nche, Charlene Brooke, NP  cephALEXin (KEFLEX) 500 MG capsule Take 1 capsule (500 mg total) by mouth 4 (four) times daily. 06/23/20   Horton, Barbette Hair, MD  cyclobenzaprine (FLEXERIL) 10 MG tablet Take 1 tablet (10 mg total) by mouth 2 (two) times daily as needed for muscle spasms. 10/20/20   Alfredia Client, PA-C  glipiZIDE (GLUCOTROL XL) 2.5 MG 24 hr tablet Take 1 tablet (2.5 mg total) by mouth daily with breakfast. 11/16/18   Nche, Charlene Brooke, NP  hydrochlorothiazide (MICROZIDE) 12.5 MG capsule Take 1 capsule (12.5 mg total) by mouth daily. 11/16/18   Nche, Charlene Brooke, NP  lidocaine (LIDODERM) 5 % Place 1 patch onto the skin daily. Remove & Discard patch within 12  hours or as directed by MD 10/20/20   Alfredia Client, PA-C  metFORMIN (GLUCOPHAGE-XR) 500 MG 24 hr tablet Take 1 tablet (500 mg total) by mouth 2 (two) times daily after a meal. 11/16/18   Nche, Charlene Brooke, NP  naproxen (NAPROSYN) 375 MG tablet Take 1 tablet (375 mg total) by mouth 2 (two) times daily as needed. 08/08/21   Quintella Reichert, MD    Allergies    Patient has no known allergies.  Review of Systems   Review of Systems  All other systems reviewed and are negative.  Physical Exam Updated Vital Signs BP (!) 174/101   Pulse 67   Temp 97.9 F (36.6 C) (Oral)   Resp 20   Ht _0  (1.778 m)   Wt 95.3 kg   SpO2 100%   BMI 30.13 kg/m   Physical Exam Vitals and nursing note reviewed.  Constitutional:       Appearance: He is well-developed.  HENT:     Head: Normocephalic and atraumatic.  Cardiovascular:     Rate and Rhythm: Normal rate and regular rhythm.     Heart sounds: No murmur heard. Pulmonary:     Effort: Pulmonary effort is normal. No respiratory distress.     Breath sounds: Normal breath sounds.  Abdominal:     Palpations: Abdomen is soft.     Tenderness: There is no abdominal tenderness. There is no guarding or rebound.  Musculoskeletal:        General: No tenderness.     Comments: No discrete bony or muscle tenderness.  Pain with full abduction of the shoulder.  Able to fully rotate the shoulder.  Primary area of pain is AC joint on movement but not reproducible on palpation.    Skin:    General: Skin is warm and dry.  Neurological:     Mental Status: He is alert and oriented to person, place, and time.     Comments: 5/5 strength in BUE in proximal and distal muscle groups.  Sensation to light touch intact in BUE.    Psychiatric:        Behavior: Behavior normal.    ED Results / Procedures / Treatments   Labs (all labs ordered are listed, but only abnormal results are displayed) Labs Reviewed - No data to display  EKG None  Radiology DG Shoulder Left  Result Date: 08/07/2021 CLINICAL DATA:  Left shoulder pain for several days following heavy lifting, initial encounter EXAM: LEFT SHOULDER - 2+ VIEW COMPARISON:  None. FINDINGS: There is no evidence of fracture or dislocation. There is no evidence of arthropathy or other focal bone abnormality. Soft tissues are unremarkable. IMPRESSION: No acute abnormality noted. Electronically Signed   By: Inez Catalina M.D.   On: 08/07/2021 22:05    Procedures Procedures   Medications Ordered in ED Medications - No data to display  ED Course  I have reviewed the triage vital signs and the nursing notes.  Pertinent labs & imaging results that were available during my care of the patient were reviewed by me and considered in my  medical decision making (see chart for details).    MDM Rules/Calculators/A&P                           patient here for evaluation of left shoulder pain. This is only present on range of motion. He does have a history of diabetes. No evidence of soft tissue infection, gouty or septic  arthritis. Current presentation is not consistent with ACS, dissection.  Discussed with patient home care for shoulder pain, likely secondary to overuse. Discussed NSAIDs. Discussed outpatient follow-up and return precautions. Final Clinical Impression(s) / ED Diagnoses Final diagnoses:  Acute pain of left shoulder    Rx / DC Orders ED Discharge Orders          Ordered    naproxen (NAPROSYN) 375 MG tablet  2 times daily PRN        08/08/21 0141             Quintella Reichert, MD 08/08/21 [phone]

## 2021-08-17 ENCOUNTER — Ambulatory Visit: Payer: Managed Care, Other (non HMO) | Admitting: Family Medicine

## 2021-10-29 ENCOUNTER — Other Ambulatory Visit: Payer: Self-pay

## 2021-10-29 ENCOUNTER — Emergency Department (HOSPITAL_BASED_OUTPATIENT_CLINIC_OR_DEPARTMENT_OTHER): Payer: Managed Care, Other (non HMO)

## 2021-10-29 ENCOUNTER — Encounter (HOSPITAL_BASED_OUTPATIENT_CLINIC_OR_DEPARTMENT_OTHER): Payer: Self-pay | Admitting: Emergency Medicine

## 2021-10-29 ENCOUNTER — Emergency Department (HOSPITAL_BASED_OUTPATIENT_CLINIC_OR_DEPARTMENT_OTHER)
Admission: EM | Admit: 2021-10-29 | Discharge: 2021-10-29 | Disposition: A | Discharge status: Home / Self Care | Payer: Managed Care, Other (non HMO) | Attending: Emergency Medicine | Admitting: Emergency Medicine

## 2021-10-29 DIAGNOSIS — Z7984 Long term (current) use of oral hypoglycemic drugs: Secondary | ICD-10-CM | POA: Diagnosis not present

## 2021-10-29 DIAGNOSIS — S6991XA Unspecified injury of right wrist, hand and finger(s), initial encounter: Secondary | ICD-10-CM | POA: Diagnosis not present

## 2021-10-29 DIAGNOSIS — W231XXA Caught, crushed, jammed, or pinched between stationary objects, initial encounter: Secondary | ICD-10-CM | POA: Diagnosis not present

## 2021-10-29 DIAGNOSIS — Z79899 Other long term (current) drug therapy: Secondary | ICD-10-CM | POA: Diagnosis not present

## 2021-10-29 HISTORY — DX: Essential (primary) hypertension: I10

## 2021-10-29 NOTE — ED Triage Notes (Signed)
Pt arrives pov with c/o right hand injury after shutting hand in door at home. Lateral swelling and tenderness noted

## 2021-10-29 NOTE — Discharge Instructions (Addendum)
Take ibuprofen 3 times a day with meals as needed for pain.  Do not take other anti-inflammatories at the same time (Advil, Motrin, naproxen, Aleve). You may supplement with Tylenol if you need further pain control. Use ice packs, 20 minutes at a time, 3 times a day to help with pain and swelling. You will likely have continued stiffness and soreness, especially with movement over the next several days.   Follow-up with your primary care doctor in a week if symptoms not improving. Return to the emergency room if you develop severe worsening pain, numbness of your finger, color change of your hand, or any new or worsening, or concerning symptoms

## 2021-10-29 NOTE — ED Provider Notes (Signed)
[facility] Provider Note   CSN: [id number] Arrival date & time: 10/29/21  1736     History  Chief Complaint  Patient presents with   Hand Injury    Trevor Medina is a 51 y.o. male presenting for evaluation of right hand injury.  Patient say just prior to arrival he accidentally closed the door on his right hand.  He reports pain of the lateral aspect of his right hand.  No numbness or tingling.  No injury elsewhere.  He has not taken anything for pain.  He is not on blood thinners  HPI     Home Medications Prior to Admission medications   Medication Sig Start Date End Date Taking? Authorizing Provider  amLODipine (NORVASC) 5 MG tablet Take 1 tablet (5 mg total) by mouth at bedtime. 11/16/18   Nche, Charlene Brooke, NP  blood glucose meter kit and supplies KIT Dispense based on patient and insurance preference. Use once a day before breakfast. E11.9 11/15/18   Nche, Charlene Brooke, NP  cephALEXin (KEFLEX) 500 MG capsule Take 1 capsule (500 mg total) by mouth 4 (four) times daily. 06/23/20   Horton, Barbette Hair, MD  cyclobenzaprine (FLEXERIL) 10 MG tablet Take 1 tablet (10 mg total) by mouth 2 (two) times daily as needed for muscle spasms. 10/20/20   Alfredia Client, PA-C  glipiZIDE (GLUCOTROL XL) 2.5 MG 24 hr tablet Take 1 tablet (2.5 mg total) by mouth daily with breakfast. 11/16/18   Nche, Charlene Brooke, NP  hydrochlorothiazide (MICROZIDE) 12.5 MG capsule Take 1 capsule (12.5 mg total) by mouth daily. 11/16/18   Nche, Charlene Brooke, NP  lidocaine (LIDODERM) 5 % Place 1 patch onto the skin daily. Remove & Discard patch within 12 hours or as directed by MD 10/20/20   Alfredia Client, PA-C  metFORMIN (GLUCOPHAGE-XR) 500 MG 24 hr tablet Take 1 tablet (500 mg total) by mouth 2 (two) times daily after a meal. 11/16/18   Nche, Charlene Brooke, NP  naproxen (NAPROSYN) 375 MG tablet Take 1 tablet (375 mg total) by mouth 2 (two) times daily as needed. 08/08/21   Quintella Reichert, MD      Allergies    Patient has no known allergies.    Review of Systems   Review of Systems  Musculoskeletal:  Positive for arthralgias and joint swelling.  Neurological:  Negative for numbness.   Physical Exam Updated Vital Signs BP (!) 151/88 (BP Location: Left Arm)    Pulse 80    Temp 98.2 F (36.8 C) (Oral)    Resp 18    Ht _0  (1.778 m)    Wt 93.4 kg    SpO2 98%    BMI 29.56 kg/m  Physical Exam Vitals and nursing note reviewed.  Constitutional:      General: He is not in acute distress.    Appearance: He is well-developed.  HENT:     Head: Normocephalic and atraumatic.  Eyes:     Extraocular Movements: Extraocular movements intact.  Cardiovascular:     Rate and Rhythm: Normal rate.  Pulmonary:     Effort: Pulmonary effort is normal.  Abdominal:     General: There is no distension.  Musculoskeletal:        General: Tenderness present. Normal range of motion.     Cervical back: Normal range of motion.     Comments: Of the thumbMild pain of the right hand on the lateral aspect.  Tenderness palpation over the fourth and fifth  metacarpals.  No tenderness palpation over the wrist.  No tenderness palpation of the fingers.  Good distal sensation and cap refill of all fingers.  No pain of the thumb or anatomic snuffbox  Skin:    General: Skin is warm.     Findings: No rash.  Neurological:     Mental Status: He is alert and oriented to person, place, and time.    ED Results / Procedures / Treatments   Labs (all labs ordered are listed, but only abnormal results are displayed) Labs Reviewed - No data to display  EKG None  Radiology DG Hand Complete Right  Result Date: 10/29/2021 CLINICAL DATA:  Right hand swelling and pain after injury EXAM: RIGHT HAND - COMPLETE 3+ VIEW COMPARISON:  None. FINDINGS: There is no evidence of acute fracture or dislocation. Probable old healed fracture deformity of the fifth metacarpal neck. There is no evidence of  arthropathy or other focal bone abnormality. Mild soft tissue swelling. IMPRESSION: Negative. Electronically Signed   By: Davina Poke D.O.   On: 10/29/2021 18:34    Procedures Procedures    Medications Ordered in ED Medications - No data to display  ED Course/ Medical Decision Making/ A&P                           Medical Decision Making Amount and/or Complexity of Data Reviewed Radiology: ordered.    This patient presents to the ED for concern of right hand pain. This involves a number of treatment options, and is a complaint that carries with it a low risk of complications and morbidity.  The differential diagnosis includes fracture, muscular injury, vascular injury  Imaging Studies: X-ray obtained from triage viewed and independently interpreted by me, no fracture or dislocation.   Disposition:  After consideration of the diagnostic results and the patients response to treatment, I feel that the patent would benefit from treatment outpatient with PCP follow-up as needed.  Discussed overall reassuring work-up.  Discussed likely muscular injury, treatment Tylenol, ibuprofen, ice.  At this time, patient appears safe for discharge.  Return precautions given.  Patient states he understands and agrees to plan    Final Clinical Impression(s) / ED Diagnoses Final diagnoses:  Injury of right hand, initial encounter    Rx / DC Orders ED Discharge Orders     None         Franchot Heidelberg, PA-C 10/29/21 Cristino Martes, MD 10/29/21 [phone]

## 2022-07-24 ENCOUNTER — Encounter (HOSPITAL_BASED_OUTPATIENT_CLINIC_OR_DEPARTMENT_OTHER): Payer: Self-pay | Admitting: Emergency Medicine

## 2022-07-24 ENCOUNTER — Emergency Department (HOSPITAL_BASED_OUTPATIENT_CLINIC_OR_DEPARTMENT_OTHER)
Admission: EM | Admit: 2022-07-24 | Discharge: 2022-07-24 | Disposition: A | Discharge status: Home / Self Care | Payer: Managed Care, Other (non HMO) | Attending: Emergency Medicine | Admitting: Emergency Medicine

## 2022-07-24 ENCOUNTER — Other Ambulatory Visit: Payer: Self-pay

## 2022-07-24 DIAGNOSIS — R0981 Nasal congestion: Secondary | ICD-10-CM | POA: Insufficient documentation

## 2022-07-24 DIAGNOSIS — Z79899 Other long term (current) drug therapy: Secondary | ICD-10-CM | POA: Insufficient documentation

## 2022-07-24 DIAGNOSIS — Z20822 Contact with and (suspected) exposure to covid-19: Secondary | ICD-10-CM | POA: Diagnosis not present

## 2022-07-24 DIAGNOSIS — I10 Essential (primary) hypertension: Secondary | ICD-10-CM | POA: Insufficient documentation

## 2022-07-24 LAB — RESP PANEL BY RT-PCR (FLU A&B, COVID) ARPGX2
Influenza A by PCR: NEGATIVE
Influenza B by PCR: NEGATIVE
SARS Coronavirus 2 by RT PCR: NEGATIVE

## 2022-07-24 NOTE — ED Provider Notes (Signed)
[facility]  Provider Note  CSN: [id number] Arrival date & time: 07/24/22 0116  History Chief Complaint  Patient presents with   Nasal Congestion    Trevor Medina is a 51 y.o. male with history of HTN reports 3 days of isolated nasal congestion and chills. No fever, cough, sore throat. No chest pain or SOB. Has tried some OTC meds without improvement.    Home Medications Prior to Admission medications   Medication Sig Start Date End Date Taking? Authorizing Provider  amLODipine (NORVASC) 5 MG tablet Take 1 tablet (5 mg total) by mouth at bedtime. 11/16/18   Nche, Charlene Brooke, NP  blood glucose meter kit and supplies KIT Dispense based on patient and insurance preference. Use once a day before breakfast. E11.9 11/15/18   Nche, Charlene Brooke, NP  cephALEXin (KEFLEX) 500 MG capsule Take 1 capsule (500 mg total) by mouth 4 (four) times daily. 06/23/20   Horton, Barbette Hair, MD  cyclobenzaprine (FLEXERIL) 10 MG tablet Take 1 tablet (10 mg total) by mouth 2 (two) times daily as needed for muscle spasms. 10/20/20   Alfredia Client, PA-C  glipiZIDE (GLUCOTROL XL) 2.5 MG 24 hr tablet Take 1 tablet (2.5 mg total) by mouth daily with breakfast. 11/16/18   Nche, Charlene Brooke, NP  hydrochlorothiazide (MICROZIDE) 12.5 MG capsule Take 1 capsule (12.5 mg total) by mouth daily. 11/16/18   Nche, Charlene Brooke, NP  lidocaine (LIDODERM) 5 % Place 1 patch onto the skin daily. Remove & Discard patch within 12 hours or as directed by MD 10/20/20   Alfredia Client, PA-C  metFORMIN (GLUCOPHAGE-XR) 500 MG 24 hr tablet Take 1 tablet (500 mg total) by mouth 2 (two) times daily after a meal. 11/16/18   Nche, Charlene Brooke, NP  naproxen (NAPROSYN) 375 MG tablet Take 1 tablet (375 mg total) by mouth 2 (two) times daily as needed. 08/08/21   Quintella Reichert, MD     Allergies    Patient has no known allergies.   Review of Systems   Review of Systems Please see HPI for pertinent positives  and negatives  Physical Exam BP (!) 152/100 (BP Location: Right Arm)   Pulse 74   Temp 99.2 F (37.3 C) (Oral)   Resp 18   Ht _0  (1.778 m)   Wt 96.6 kg   SpO2 99%   BMI 30.56 kg/m   Physical Exam Vitals and nursing note reviewed.  Constitutional:      Appearance: Normal appearance.  HENT:     Head: Normocephalic and atraumatic.     Nose: Congestion present.     Mouth/Throat:     Mouth: Mucous membranes are moist.  Eyes:     Extraocular Movements: Extraocular movements intact.     Conjunctiva/sclera: Conjunctivae normal.  Cardiovascular:     Rate and Rhythm: Normal rate.  Pulmonary:     Effort: Pulmonary effort is normal.     Breath sounds: Normal breath sounds.  Abdominal:     General: Abdomen is flat.     Palpations: Abdomen is soft.     Tenderness: There is no abdominal tenderness.  Musculoskeletal:        General: No swelling. Normal range of motion.     Cervical back: Neck supple.  Skin:    General: Skin is warm and dry.  Neurological:     General: No focal deficit present.     Mental Status: He is alert.  Psychiatric:  Mood and Affect: Mood normal.     ED Results / Procedures / Treatments   EKG EKG Interpretation  Date/Time:  Monday July 24 2022 01:22:50 EDT Ventricular Rate:  80 PR Interval:  172 QRS Duration: 112 QT Interval:  366 QTC Calculation: 422 R Axis:   267 Text Interpretation: Normal sinus rhythm Right superior axis deviation Incomplete right bundle branch block Abnormal ECG When compared with ECG of 30-Jun-2015 11:23, No significant change since last tracing Confirmed by Calvert Cantor [phone]) on 07/24/2022 5:20:21 AM  Procedures Procedures  Medications Ordered in the ED Medications - No data to display  Initial Impression and Plan  Patient here for nasal congestion, initially checked in with chief complaint of chest pain but states 'that was a mistake'. EKG is unremarkable. Exam is benign. Covid is neg. Recommend  Coricidin and Flonase for symptom relief. PCP follow up. Patient requesting a work note.   ED Course       MDM Rules/Calculators/A&P Medical Decision Making Problems Addressed: Nasal congestion: acute illness or injury Uncontrolled hypertension: chronic illness or injury with exacerbation, progression, or side effects of treatment  Amount and/or Complexity of Data Reviewed Labs: ordered. Decision-making details documented in ED Course.    Final Clinical Impression(s) / ED Diagnoses Final diagnoses:  Nasal congestion  Uncontrolled hypertension    Rx / DC Orders ED Discharge Orders     None        Truddie Hidden, MD 07/24/22 [phone]

## 2022-07-24 NOTE — ED Triage Notes (Signed)
"   The last couple days I've been congested like a cold coming on." No known fevers. Denies CP or SHOB. Only sx pt mentions is nasal congestion.

## 2022-07-24 NOTE — Discharge Instructions (Signed)
You can use Flonase spray and the Coricidin, both available over the counter, for your congestion. Continue your BP meds and follow up with your doctor for a recheck.

## 2023-01-17 ENCOUNTER — Encounter: Payer: Self-pay | Admitting: *Deleted

## 2023-02-11 ENCOUNTER — Ambulatory Visit
Admission: EM | Admit: 2023-02-11 | Discharge: 2023-02-11 | Disposition: A | Discharge status: Home / Self Care | Payer: Managed Care, Other (non HMO) | Attending: Family Medicine | Admitting: Family Medicine

## 2023-02-11 DIAGNOSIS — B379 Candidiasis, unspecified: Secondary | ICD-10-CM | POA: Diagnosis not present

## 2023-02-11 MED ORDER — KETOCONAZOLE 2 % EX CREA: 1.0000 | TOPICAL_CREAM | Freq: Every day | CUTANEOUS | 0 refills | Status: DC

## 2023-02-11 MED ORDER — FLUCONAZOLE 100 MG PO TABS: ORAL_TABLET | ORAL | 0 refills | Status: DC

## 2023-02-11 NOTE — ED Triage Notes (Signed)
Pt c/o yeast infection to uncircumcised penis after changing soap-NAD-steady gait

## 2023-02-11 NOTE — ED Provider Notes (Signed)
[facility]    CSN: [id number] Arrival date & time: 02/11/23  0840      History   Chief Complaint No chief complaint on file.   HPI Trevor Medina is a 52 y.o. male.   HPI Here for redness and irritation of the head of his penis.  This been bothering him a few days.  No fever and no dysuria.    He has had a similar problem several years ago and use some over-the-counter antifungal cream, but it has not been helping this time.  He states for 1, that cream is old, and also he is continue to use some new laundry detergent and new bath soap, both of which have more perfumes in them.  He also has diabetes and his sugars have been high  Past Medical History:  Diagnosis Date   Chronic back pain    Diabetes mellitus without complication (HCC)    no meds - was given Rx for Metformin in March 2016 at urgent care ([facility]) but never filled Rx. Planned to treat with "diet and exercise"   History of gout    HNP (herniated nucleus pulposus), lumbar    Hypertension    Spinal stenosis     Patient Active Problem List   Diagnosis Date Noted   Essential hypertension 09/14/2015   HNP (herniated nucleus pulposus), lumbar 07/07/2015   Uncontrolled diabetes mellitus (HCC) 07/07/2015   Obesity 07/07/2015    Past Surgical History:  Procedure Laterality Date   ANKLE SURGERY     LUMBAR LAMINECTOMY/DECOMPRESSION MICRODISCECTOMY Bilateral 07/07/2015   Procedure: DECOMPRESSION L5-S1 BILATERALLY;  Surgeon: Jene Every, MD;  Location: [facility];  Service: Orthopedics;  Laterality: Bilateral;       Home Medications    Prior to Admission medications   Medication Sig Start Date End Date Taking? Authorizing Provider  fluconazole (DIFLUCAN) 100 MG tablet 2 tablets by mouth the first day, then 1 tablet by mouth once daily for 6 more days. 02/11/23  Yes Zenia Resides, MD  ketoconazole (NIZORAL) 2 % cream Apply 1 Application topically daily. To affected area until healed, about 2  weeks. 02/11/23  Yes Zenia Resides, MD  amLODipine (NORVASC) 5 MG tablet Take 1 tablet (5 mg total) by mouth at bedtime. 11/16/18   Nche, Bonna Gains, NP  blood glucose meter kit and supplies KIT Dispense based on patient and insurance preference. Use once a day before breakfast. E11.9 11/15/18   Nche, Bonna Gains, NP  glipiZIDE (GLUCOTROL XL) 2.5 MG 24 hr tablet Take 1 tablet (2.5 mg total) by mouth daily with breakfast. 11/16/18   Nche, Bonna Gains, NP  hydrochlorothiazide (MICROZIDE) 12.5 MG capsule Take 1 capsule (12.5 mg total) by mouth daily. 11/16/18   Nche, Bonna Gains, NP  lidocaine (LIDODERM) 5 % Place 1 patch onto the skin daily. Remove & Discard patch within 12 hours or as directed by MD 10/20/20   Farrel Gordon, PA-C  metFORMIN (GLUCOPHAGE-XR) 500 MG 24 hr tablet Take 1 tablet (500 mg total) by mouth 2 (two) times daily after a meal. 11/16/18   Nche, Bonna Gains, NP    Family History Family History  Problem Relation Age of Onset   Cancer Mother 67       ovarian cancer   Diabetes Father     Social History Social History   Tobacco Use   Smoking status: Never   Smokeless tobacco: Never  Vaping Use   Vaping Use: Never used  Substance Use Topics  Alcohol use: Yes    Alcohol/week: 2.0 standard drinks of alcohol    Types: 1 Cans of beer, 1 Shots of liquor per week    Comment: on weekend   Drug use: No     Allergies   Patient has no known allergies.   Review of Systems Review of Systems   Physical Exam Triage Vital Signs ED Triage Vitals [02/11/23 0853]  Enc Vitals Group     BP (!) 189/104     Pulse Rate 71     Resp 20     Temp 98.3 F (36.8 C)     Temp Source Oral     SpO2 98 %     Weight      Height      Head Circumference      Peak Flow      Pain Score 0     Pain Loc      Pain Edu?      Excl. in GC?    No data found.  Updated Vital Signs BP (!) 189/104 (BP Location: Left Arm) Comment: out of BP meds  Pulse 71   Temp 98.3 F (36.8  C) (Oral)   Resp 20   SpO2 98%   Visual Acuity Right Eye Distance:   Left Eye Distance:   Bilateral Distance:    Right Eye Near:   Left Eye Near:    Bilateral Near:     Physical Exam Vitals reviewed.  Constitutional:      General: He is not in acute distress.    Appearance: He is not ill-appearing, toxic-appearing or diaphoretic.  Genitourinary:    Comments: There is erythema of the head of the penis with scattered excoriated areas.  There is no edema at this time.  The excoriated areas are NOT consistent with a herpetiform rash. Skin:    Coloration: Skin is not pale.  Neurological:     Mental Status: He is alert and oriented to person, place, and time.  Psychiatric:        Behavior: Behavior normal.      UC Treatments / Results  Labs (all labs ordered are listed, but only abnormal results are displayed) Labs Reviewed - No data to display  EKG   Radiology No results found.  Procedures Procedures (including critical care time)  Medications Ordered in UC Medications - No data to display  Initial Impression / Assessment and Plan / UC Course  I have reviewed the triage vital signs and the nursing notes.  Pertinent labs & imaging results that were available during my care of the patient were reviewed by me and considered in my medical decision making (see chart for details).        Fluconazole tablets are sent in to take for a week and topical Nizoral cream prescription is sent in.  I did discuss with him that his sugars been elevated might be contributing to this.  Also he has been a change back to his laundry and bath products which have less perfumes. Final Clinical Impressions(s) / UC Diagnoses   Final diagnoses:  Candidiasis     Discharge Instructions      Fluconazole 100 mg--take 2 tablets today, then 1 tablet daily for another 6 days  Apply ketoconazole cream to the affected area once daily until resolved, about 2 weeks     ED  Prescriptions     Medication Sig Dispense Auth. Provider   fluconazole (DIFLUCAN) 100 MG tablet 2 tablets by mouth the  first day, then 1 tablet by mouth once daily for 6 more days. 8 tablet Zenia Resides, MD   ketoconazole (NIZORAL) 2 % cream Apply 1 Application topically daily. To affected area until healed, about 2 weeks. 30 g Zenia Resides, MD      PDMP not reviewed this encounter.   Zenia Resides, MD 02/11/23 [phone]

## 2023-02-11 NOTE — Discharge Instructions (Addendum)
Fluconazole 100 mg--take 2 tablets today, then 1 tablet daily for another 6 days  Apply ketoconazole cream to the affected area once daily until resolved, about 2 weeks

## 2023-04-05 ENCOUNTER — Encounter (HOSPITAL_BASED_OUTPATIENT_CLINIC_OR_DEPARTMENT_OTHER): Payer: Self-pay | Admitting: Emergency Medicine

## 2023-04-05 ENCOUNTER — Emergency Department (HOSPITAL_BASED_OUTPATIENT_CLINIC_OR_DEPARTMENT_OTHER): Payer: Managed Care, Other (non HMO)

## 2023-04-05 ENCOUNTER — Emergency Department (HOSPITAL_BASED_OUTPATIENT_CLINIC_OR_DEPARTMENT_OTHER)
Admission: EM | Admit: 2023-04-05 | Discharge: 2023-04-05 | Disposition: A | Discharge status: Home / Self Care | Payer: Managed Care, Other (non HMO) | Attending: Emergency Medicine | Admitting: Emergency Medicine

## 2023-04-05 ENCOUNTER — Other Ambulatory Visit: Payer: Self-pay

## 2023-04-05 DIAGNOSIS — R1084 Generalized abdominal pain: Secondary | ICD-10-CM | POA: Insufficient documentation

## 2023-04-05 DIAGNOSIS — E1165 Type 2 diabetes mellitus with hyperglycemia: Secondary | ICD-10-CM | POA: Diagnosis not present

## 2023-04-05 DIAGNOSIS — R748 Abnormal levels of other serum enzymes: Secondary | ICD-10-CM | POA: Insufficient documentation

## 2023-04-05 DIAGNOSIS — I10 Essential (primary) hypertension: Secondary | ICD-10-CM | POA: Insufficient documentation

## 2023-04-05 DIAGNOSIS — R739 Hyperglycemia, unspecified: Secondary | ICD-10-CM

## 2023-04-05 LAB — LIPASE, BLOOD: Lipase: 78 U/L — ABNORMAL HIGH (ref 11–51)

## 2023-04-05 LAB — COMPREHENSIVE METABOLIC PANEL
ALT: 22 U/L (ref 0–44)
AST: 20 U/L (ref 15–41)
Albumin: 3.3 g/dL — ABNORMAL LOW (ref 3.5–5.0)
Alkaline Phosphatase: 62 U/L (ref 38–126)
Anion gap: 6 (ref 5–15)
BUN: 22 mg/dL — ABNORMAL HIGH (ref 6–20)
CO2: 24 mmol/L (ref 22–32)
Calcium: 8.7 mg/dL — ABNORMAL LOW (ref 8.9–10.3)
Chloride: 105 mmol/L (ref 98–111)
Creatinine, Ser: 1.35 mg/dL — ABNORMAL HIGH (ref 0.61–1.24)
GFR, Estimated: >60 mL/min (ref >60)
Glucose, Bld: 265 mg/dL — ABNORMAL HIGH (ref 70–99)
Potassium: 4.8 mmol/L (ref 3.5–5.1)
Sodium: 135 mmol/L (ref 135–145)
Total Bilirubin: 0.7 mg/dL (ref 0.3–1.2)
Total Protein: 6.9 g/dL (ref 6.5–8.1)

## 2023-04-05 LAB — CBC WITH DIFFERENTIAL/PLATELET
Abs Immature Granulocytes: 0.01 10*3/uL (ref 0.00–0.07)
Basophils Absolute: 0 10*3/uL (ref 0.0–0.1)
Basophils Relative: 1 %
Eosinophils Absolute: 0.4 10*3/uL (ref 0.0–0.5)
Eosinophils Relative: 8 %
HCT: 36.2 % — ABNORMAL LOW (ref 39.0–52.0)
Hemoglobin: 12.3 g/dL — ABNORMAL LOW (ref 13.0–17.0)
Immature Granulocytes: 0 %
Lymphocytes Relative: 36 %
Lymphs Abs: 2 10*3/uL (ref 0.7–4.0)
MCH: 26.6 pg (ref 26.0–34.0)
MCHC: 34 g/dL (ref 30.0–36.0)
MCV: 78.2 fL — ABNORMAL LOW (ref 80.0–100.0)
Monocytes Absolute: 0.4 10*3/uL (ref 0.1–1.0)
Monocytes Relative: 8 %
Neutro Abs: 2.6 10*3/uL (ref 1.7–7.7)
Neutrophils Relative %: 47 %
Platelets: 291 10*3/uL (ref 150–400)
RBC: 4.63 MIL/uL (ref 4.22–5.81)
RDW: 13.5 % (ref 11.5–15.5)
WBC: 5.6 10*3/uL (ref 4.0–10.5)
nRBC: 0 % (ref 0.0–0.2)

## 2023-04-05 LAB — URINALYSIS, ROUTINE W REFLEX MICROSCOPIC
Bilirubin Urine: NEGATIVE
Glucose, UA: 250 mg/dL — AB
Ketones, ur: NEGATIVE mg/dL
Leukocytes,Ua: NEGATIVE
Nitrite: NEGATIVE
Protein, ur: >=300 mg/dL — AB
Specific Gravity, Urine: >=1.03 (ref 1.005–1.030)
pH: 5.5 (ref 5.0–8.0)

## 2023-04-05 LAB — URINALYSIS, MICROSCOPIC (REFLEX)
Bacteria, UA: NONE SEEN
RBC / HPF: NONE SEEN RBC/hpf (ref 0–5)
WBC, UA: NONE SEEN WBC/hpf (ref 0–5)

## 2023-04-05 LAB — TROPONIN I (HIGH SENSITIVITY)
Troponin I (High Sensitivity): 5 ng/L (ref <18)
Troponin I (High Sensitivity): 5 ng/L (ref <18)

## 2023-04-05 MED ORDER — ALUM & MAG HYDROXIDE-SIMETH 200-200-20 MG/5ML PO SUSP
15.0000 mL | Freq: Once | ORAL | Status: AC
  Administered 2023-04-05: 15 mL via ORAL
  Filled 2023-04-05: qty 30

## 2023-04-05 MED ORDER — PANTOPRAZOLE SODIUM 40 MG PO TBEC: 40.0000 mg | DELAYED_RELEASE_TABLET | Freq: Every day | ORAL | 0 refills | Status: DC

## 2023-04-05 MED ORDER — DICYCLOMINE HCL 20 MG PO TABS: 20.0000 mg | ORAL_TABLET | Freq: Three times a day (TID) | ORAL | 0 refills | Status: DC | PRN

## 2023-04-05 MED ORDER — IOHEXOL 300 MG/ML  SOLN
100.0000 mL | Freq: Once | INTRAMUSCULAR | Status: AC | PRN
  Administered 2023-04-05: 100 mL via INTRAVENOUS

## 2023-04-05 MED ORDER — DICYCLOMINE HCL 10 MG PO CAPS
10.0000 mg | ORAL_CAPSULE | Freq: Once | ORAL | Status: AC
  Administered 2023-04-05: 10 mg via ORAL
  Filled 2023-04-05: qty 1

## 2023-04-05 MED ORDER — ONDANSETRON 4 MG PO TBDP: 4.0000 mg | ORAL_TABLET | Freq: Three times a day (TID) | ORAL | 0 refills | Status: DC | PRN

## 2023-04-05 NOTE — Discharge Instructions (Signed)

## 2023-04-05 NOTE — ED Provider Notes (Signed)
Emergency Department Provider Note   I have reviewed the triage vital signs and the nursing notes.   HISTORY  Chief Complaint Abdominal Pain   HPI Trevor Medina is a 52 y.o. male with PMH of DM and HTN presents to the emergency department for evaluation of generalized abdominal pain.  He said symptoms for 3 to 4 days initially attributed pain to gas or stress.  He states he has been under significant stress recently.  His diet is not as good as normal and tends to drink a lot of sodas and energy drinks.  Denies any pain up into his chest, lightheadedness, shortness of breath.  No nausea, vomiting, diarrhea.  With continued symptoms he was advised to present to the ED for evaluation.  No prior surgical history.  Past Medical History:  Diagnosis Date   Chronic back pain    Diabetes mellitus without complication (HCC)    no meds - was given Rx for Metformin in March 2016 at urgent care ([facility]) but never filled Rx. Planned to treat with "diet and exercise"   History of gout    HNP (herniated nucleus pulposus), lumbar    Hypertension    Spinal stenosis     Review of Systems  Constitutional: No fever/chills Cardiovascular: Denies chest pain. Respiratory: Denies shortness of breath. Gastrointestinal: Positive abdominal pain.  No nausea, no vomiting.  No diarrhea.  No constipation. Genitourinary: Negative for dysuria. Musculoskeletal: Negative for back pain. Skin: Negative for rash. Neurological: Negative for headaches, focal weakness or numbness.   ____________________________________________   PHYSICAL EXAM:  VITAL SIGNS: ED Triage Vitals  Enc Vitals Group     BP 04/05/23 0227 136/86     Pulse Rate 04/05/23 0227 76     Resp 04/05/23 0227 18     Temp 04/05/23 0227 98.3 F (36.8 C)     Temp Source 04/05/23 0227 Oral     SpO2 04/05/23 0227 98 %     Weight 04/05/23 0228 211 lb 10.3 oz (96 kg)     Height 04/05/23 0228 5\' 10"  (1.778 m)   Constitutional: Alert  and oriented. Well appearing and in no acute distress. Eyes: Conjunctivae are normal.  Head: Atraumatic. Nose: No congestion/rhinnorhea. Mouth/Throat: Mucous membranes are moist. Neck: No stridor.   Cardiovascular: Normal rate, regular rhythm. Good peripheral circulation. Grossly normal heart sounds.   Respiratory: Normal respiratory effort.  No retractions. Lungs CTAB. Gastrointestinal: Soft with mild periumbilical tenderness. No hernia. No peritonitis. No distention.  Musculoskeletal: No lower extremity tenderness nor edema. No gross deformities of extremities. Neurologic:  Normal speech and language. No gross focal neurologic deficits are appreciated.  Skin:  Skin is warm, dry and intact. No rash noted.  ____________________________________________   LABS (all labs ordered are listed, but only abnormal results are displayed)  Labs Reviewed  COMPREHENSIVE METABOLIC PANEL - Abnormal; Notable for the following components:      Result Value   Glucose, Bld 265 (*)    BUN 22 (*)    Creatinine, Ser 1.35 (*)    Calcium 8.7 (*)    Albumin 3.3 (*)    All other components within normal limits  LIPASE, BLOOD - Abnormal; Notable for the following components:   Lipase 78 (*)    All other components within normal limits  CBC WITH DIFFERENTIAL/PLATELET - Abnormal; Notable for the following components:   Hemoglobin 12.3 (*)    HCT 36.2 (*)    MCV 78.2 (*)    All other components  within normal limits  URINALYSIS, ROUTINE W REFLEX MICROSCOPIC - Abnormal; Notable for the following components:   Glucose, UA 250 (*)    Hgb urine dipstick TRACE (*)    Protein, ur >=300 (*)    All other components within normal limits  URINALYSIS, MICROSCOPIC (REFLEX)  TROPONIN I (HIGH SENSITIVITY)  TROPONIN I (HIGH SENSITIVITY)   ____________________________________________  EKG   EKG Interpretation Date/Time:  Thursday April 05 2023 02:57:46 EDT Ventricular Rate:  76 PR Interval:  169 QRS  Duration:  110 QT Interval:  383 QTC Calculation: 431 R Axis:   269  Text Interpretation: Sinus rhythm Incomplete RBBB and LAFB Consider right ventricular hypertrophy Borderline ST elevation, anterolateral leads Similar to October 2023 Confirmed by Alona Bene [phone]) on 04/05/2023 3:07:29 AM        ____________________________________________  RADIOLOGY  CT ABDOMEN PELVIS W CONTRAST  Result Date: 04/05/2023 CLINICAL DATA:  Acute, nonlocalized abdominal pain for 3-4 days EXAM: CT ABDOMEN AND PELVIS WITH CONTRAST TECHNIQUE: Multidetector CT imaging of the abdomen and pelvis was performed using the standard protocol following bolus administration of intravenous contrast. RADIATION DOSE REDUCTION: This exam was performed according to the departmental dose-optimization program which includes automated exposure control, adjustment of the mA and/or kV according to patient size and/or use of iterative reconstruction technique. CONTRAST:  OMNIPAQUE IOHEXOL 300 MG/ML  SOLN COMPARISON:  None Available. FINDINGS: Lower chest:  No contributory findings. Hepatobiliary: Generous caudate lobe without definite surface lobulation. No focal liver abnormality.No evidence of biliary obstruction or stone. Pancreas: Unremarkable. Spleen: Unremarkable. Adrenals/Urinary Tract: Negative adrenals. No hydronephrosis or stone. 1 cm simple appearing cyst at the upper pole right kidney, no follow-up imaging is recommended. Unremarkable bladder. Stomach/Bowel:  No obstruction. No appendicitis. Vascular/Lymphatic: No acute vascular abnormality. Extensive atheromatous calcification of the aorta and iliacs for age. No mass or adenopathy. Coarse calcifications in the small bowel mesentery, likely old granulomatous disease. Reproductive:No pathologic findings. Other: No ascites or pneumoperitoneum. Musculoskeletal: No acute abnormalities. Lumbar spine degeneration especially advanced at the L5-S1 disc space. IMPRESSION: 1. No  acute finding or specific cause for symptoms. 2. Premature atherosclerosis of the aorta. Electronically Signed   By: Tiburcio Pea M.D.   On: 04/05/2023 05:02    ____________________________________________   PROCEDURES  Procedure(s) performed:   Procedures  None  ____________________________________________   INITIAL IMPRESSION / ASSESSMENT AND PLAN / ED COURSE  Pertinent labs & imaging results that were available during my care of the patient were reviewed by me and considered in my medical decision making (see chart for details).   This patient is Presenting for Evaluation of abdominal pain, which does require a range of treatment options, and is a complaint that involves a high risk of morbidity and mortality.  The Differential Diagnoses includes but is not exclusive to acute cholecystitis, intrathoracic causes for epigastric abdominal pain, gastritis, duodenitis, pancreatitis, small bowel or large bowel obstruction, abdominal aortic aneurysm, hernia, gastritis, etc.   Critical Interventions-    Medications  alum & mag hydroxide-simeth (MAALOX/MYLANTA) 200-200-20 MG/5ML suspension 15 mL (15 mLs Oral Given 04/05/23 0305)  dicyclomine (BENTYL) capsule 10 mg (10 mg Oral Given 04/05/23 0304)  iohexol (OMNIPAQUE) 300 MG/ML solution 100 mL (100 mLs Intravenous Contrast Given 04/05/23 0447)    Reassessment after intervention:  symptoms improved.    Clinical Laboratory Tests Ordered, included negative troponin.  No evidence of urinary tract infection.  Minimally elevated lipase with normal LFTs and bilirubin.  Patient has hyperglycemia but no evidence of  DKA.  Radiologic Tests Ordered, included CT abdomen/pelvis. I independently interpreted the images and agree with radiology interpretation.   Cardiac Monitor Tracing which shows NSR.    Social Determinants of Health Risk patient is a non-smoker.   Medical Decision Making: Summary:  Patient presents to the emergency department for  evaluation of abdominal pain.  Mild tenderness on exam.  No prior surgical history. Plan for CT and reassess.   Reevaluation with update and discussion with patient.  He is feeling much better after medication in the ED.  CT without acute abnormality.  Will send medications for symptoms and have patient follow closely with his primary care doctor regarding his ED visit as well as hyperglycemia. He is complaint with his metformin.   Patient's presentation is most consistent with acute presentation with potential threat to life or bodily function.   Disposition: discharge  ____________________________________________  FINAL CLINICAL IMPRESSION(S) / ED DIAGNOSES  Final diagnoses:  Generalized abdominal pain  Hyperglycemia     NEW OUTPATIENT MEDICATIONS STARTED DURING THIS VISIT:  Discharge Medication List as of 04/05/2023  5:20 AM     START taking these medications   Details  dicyclomine (BENTYL) 20 MG tablet Take 1 tablet (20 mg total) by mouth 3 (three) times daily as needed for spasms., Starting Thu 04/05/2023, Normal    ondansetron (ZOFRAN-ODT) 4 MG disintegrating tablet Take 1 tablet (4 mg total) by mouth every 8 (eight) hours as needed., Starting Thu 04/05/2023, Normal    pantoprazole (PROTONIX) 40 MG tablet Take 1 tablet (40 mg total) by mouth daily., Starting Thu 04/05/2023, Until Sat 05/05/2023, Normal        Note:  This document was prepared using Dragon voice recognition software and may include unintentional dictation errors.  Alona Bene, MD, [facility]    Joe Gee, Arlyss Repress, MD 04/05/23 [phone]

## 2023-04-05 NOTE — ED Triage Notes (Signed)
Patient from work w/ complains of abdominal pain at the umbilicus area for 3-4 days. Patient states he is undergoing a lot of stress over the last few weeks and unsure if related. Also recently quit drinking energy drinks. Patient denies n/v/d. Denies urinary symptoms. Aox4.

## 2023-05-31 ENCOUNTER — Ambulatory Visit
Admission: EM | Admit: 2023-05-31 | Discharge: 2023-05-31 | Disposition: A | Discharge status: Home / Self Care | Payer: Managed Care, Other (non HMO) | Attending: Internal Medicine | Admitting: Internal Medicine

## 2023-05-31 ENCOUNTER — Other Ambulatory Visit: Payer: Self-pay

## 2023-05-31 DIAGNOSIS — R0981 Nasal congestion: Secondary | ICD-10-CM

## 2023-05-31 DIAGNOSIS — U071 COVID-19: Secondary | ICD-10-CM | POA: Diagnosis not present

## 2023-05-31 MED ORDER — IPRATROPIUM BROMIDE 0.03 % NA SOLN: 2.0000 | Freq: Two times a day (BID) | NASAL | 0 refills | Status: DC

## 2023-05-31 MED ORDER — CETIRIZINE HCL 10 MG PO TABS: 10.0000 mg | ORAL_TABLET | Freq: Every day | ORAL | 0 refills | Status: DC

## 2023-05-31 NOTE — ED Triage Notes (Signed)
Pt presents to UC w/ c/o nasal congestion, runny nose since yesterday.

## 2023-05-31 NOTE — Discharge Instructions (Signed)
The clinic will contact you with results of the COVID test done today if positive.  Start Zyrtec daily for allergy symptoms.  You may also use Atrovent nasal spray for congestion.  Lots of rest and fluids.  Please follow-up with your PCP if your symptoms do not improve.  Please go to the emergency room for any worsening symptoms.  I hope you feel better soon!

## 2023-05-31 NOTE — ED Provider Notes (Signed)
[facility]    CSN: [id number] Arrival date & time: 05/31/23  1828      History   Chief Complaint No chief complaint on file.   HPI Trevor Medina is a 52 y.o. male  presents for evaluation of URI symptoms for 1 days. Patient reports associated symptoms of congestion and sneezing. Denies N/V/D, cough, sore throat, fevers, chills, body aches, shortness of breath. Patient does not have a hx of asthma or smoking. No known sick contacts.  Pt has taken nothing OTC for symptoms. Pt has no other concerns at this time.   HPI  Past Medical History:  Diagnosis Date   Chronic back pain    Diabetes mellitus without complication (HCC)    no meds - was given Rx for Metformin in March 2016 at urgent care ([facility]) but never filled Rx. Planned to treat with "diet and exercise"   History of gout    HNP (herniated nucleus pulposus), lumbar    Hypertension    Spinal stenosis     Patient Active Problem List   Diagnosis Date Noted   Essential hypertension 09/14/2015   HNP (herniated nucleus pulposus), lumbar 07/07/2015   Uncontrolled diabetes mellitus (HCC) 07/07/2015   Obesity 07/07/2015    Past Surgical History:  Procedure Laterality Date   ANKLE SURGERY     LUMBAR LAMINECTOMY/DECOMPRESSION MICRODISCECTOMY Bilateral 07/07/2015   Procedure: DECOMPRESSION L5-S1 BILATERALLY;  Surgeon: Jene Every, MD;  Location: [facility];  Service: Orthopedics;  Laterality: Bilateral;       Home Medications    Prior to Admission medications   Medication Sig Start Date End Date Taking? Authorizing Provider  cetirizine (ZYRTEC) 10 MG tablet Take 1 tablet (10 mg total) by mouth daily. 05/31/23 06/30/23 Yes Radford Pax, NP  ipratropium (ATROVENT) 0.03 % nasal spray Place 2 sprays into both nostrils every 12 (twelve) hours. 05/31/23  Yes Radford Pax, NP  amLODipine (NORVASC) 5 MG tablet Take 1 tablet (5 mg total) by mouth at bedtime. 11/16/18   Nche, Bonna Gains, NP  blood glucose meter  kit and supplies KIT Dispense based on patient and insurance preference. Use once a day before breakfast. E11.9 11/15/18   Nche, Bonna Gains, NP  dicyclomine (BENTYL) 20 MG tablet Take 1 tablet (20 mg total) by mouth 3 (three) times daily as needed for spasms. 04/05/23   Long, Arlyss Repress, MD  fluconazole (DIFLUCAN) 100 MG tablet 2 tablets by mouth the first day, then 1 tablet by mouth once daily for 6 more days. 02/11/23   Zenia Resides, MD  glipiZIDE (GLUCOTROL XL) 2.5 MG 24 hr tablet Take 1 tablet (2.5 mg total) by mouth daily with breakfast. 11/16/18   Nche, Bonna Gains, NP  hydrochlorothiazide (MICROZIDE) 12.5 MG capsule Take 1 capsule (12.5 mg total) by mouth daily. 11/16/18   Nche, Bonna Gains, NP  ketoconazole (NIZORAL) 2 % cream Apply 1 Application topically daily. To affected area until healed, about 2 weeks. 02/11/23   Zenia Resides, MD  lidocaine (LIDODERM) 5 % Place 1 patch onto the skin daily. Remove & Discard patch within 12 hours or as directed by MD 10/20/20   Farrel Gordon, PA-C  metFORMIN (GLUCOPHAGE-XR) 500 MG 24 hr tablet Take 1 tablet (500 mg total) by mouth 2 (two) times daily after a meal. 11/16/18   Nche, Bonna Gains, NP  ondansetron (ZOFRAN-ODT) 4 MG disintegrating tablet Take 1 tablet (4 mg total) by mouth every 8 (eight) hours as needed. 04/05/23  Long, Arlyss Repress, MD  pantoprazole (PROTONIX) 40 MG tablet Take 1 tablet (40 mg total) by mouth daily. 04/05/23 05/05/23  Long, Arlyss Repress, MD    Family History Family History  Problem Relation Age of Onset   Cancer Mother 41       ovarian cancer   Diabetes Father     Social History Social History   Tobacco Use   Smoking status: Never   Smokeless tobacco: Never  Vaping Use   Vaping status: Never Used  Substance Use Topics   Alcohol use: Yes    Alcohol/week: 2.0 standard drinks of alcohol    Types: 1 Cans of beer, 1 Shots of liquor per week    Comment: on weekend   Drug use: No     Allergies   Patient has no  known allergies.   Review of Systems Review of Systems  HENT:  Positive for congestion and sneezing.      Physical Exam Triage Vital Signs ED Triage Vitals [05/31/23 1858]  Encounter Vitals Group     BP (!) 156/92     Systolic BP Percentile      Diastolic BP Percentile      Pulse Rate 75     Resp 16     Temp 98.3 F (36.8 C)     Temp Source Oral     SpO2 96 %     Weight      Height      Head Circumference      Peak Flow      Pain Score      Pain Loc      Pain Education      Exclude from Growth Chart    No data found.  Updated Vital Signs BP (!) 156/92 (BP Location: Left Arm)   Pulse 75   Temp 98.3 F (36.8 C) (Oral)   Resp 16   SpO2 96%   Visual Acuity Right Eye Distance:   Left Eye Distance:   Bilateral Distance:    Right Eye Near:   Left Eye Near:    Bilateral Near:     Physical Exam Vitals and nursing note reviewed.  Constitutional:      General: He is not in acute distress.    Appearance: Normal appearance. He is not ill-appearing or toxic-appearing.  HENT:     Head: Normocephalic and atraumatic.     Right Ear: Tympanic membrane and ear canal normal.     Left Ear: Tympanic membrane and ear canal normal.     Nose: Congestion present.     Mouth/Throat:     Mouth: Mucous membranes are moist.     Pharynx: No posterior oropharyngeal erythema.  Eyes:     Pupils: Pupils are equal, round, and reactive to light.  Cardiovascular:     Rate and Rhythm: Normal rate and regular rhythm.     Heart sounds: Normal heart sounds.  Pulmonary:     Effort: Pulmonary effort is normal.     Breath sounds: Normal breath sounds.  Musculoskeletal:     Cervical back: Normal range of motion and neck supple.  Lymphadenopathy:     Cervical: No cervical adenopathy.  Skin:    General: Skin is warm and dry.  Neurological:     General: No focal deficit present.     Mental Status: He is alert and oriented to person, place, and time.  Psychiatric:        Mood and Affect:  Mood normal.  Behavior: Behavior normal.      UC Treatments / Results  Labs (all labs ordered are listed, but only abnormal results are displayed) Labs Reviewed  SARS CORONAVIRUS 2 (TAT 6-24 HRS)    EKG   Radiology No results found.  Procedures Procedures (including critical care time)  Medications Ordered in UC Medications - No data to display  Initial Impression / Assessment and Plan / UC Course  I have reviewed the triage vital signs and the nursing notes.  Pertinent labs & imaging results that were available during my care of the patient were reviewed by me and considered in my medical decision making (see chart for details).     Reviewed exam and symptoms with patient.  No red flags.  COVID PCR and will contact if positive.  Discussed likely allergy component, start cetirizine daily.  Atrovent nasal spray as needed for congestion.  PCP follow-up if symptoms do not improve.  ER precautions reviewed and patient verbalized understanding. Final Clinical Impressions(s) / UC Diagnoses   Final diagnoses:  Nasal congestion     Discharge Instructions      The clinic will contact you with results of the COVID test done today if positive.  Start Zyrtec daily for allergy symptoms.  You may also use Atrovent nasal spray for congestion.  Lots of rest and fluids.  Please follow-up with your PCP if your symptoms do not improve.  Please go to the emergency room for any worsening symptoms.  I hope you feel better soon!    ED Prescriptions     Medication Sig Dispense Auth. Provider   cetirizine (ZYRTEC) 10 MG tablet Take 1 tablet (10 mg total) by mouth daily. 30 tablet Radford Pax, NP   ipratropium (ATROVENT) 0.03 % nasal spray Place 2 sprays into both nostrils every 12 (twelve) hours. 13.6 mL Radford Pax, NP      PDMP not reviewed this encounter.   Radford Pax, NP 05/31/23 Ernestina [place]

## 2023-06-01 LAB — SARS CORONAVIRUS 2 (TAT 6-24 HRS): SARS Coronavirus 2: NEGATIVE

## 2023-07-26 ENCOUNTER — Encounter: Payer: Self-pay | Admitting: Emergency Medicine

## 2023-07-26 ENCOUNTER — Ambulatory Visit
Admission: EM | Admit: 2023-07-26 | Discharge: 2023-07-26 | Disposition: A | Discharge status: Home / Self Care | Payer: Managed Care, Other (non HMO) | Attending: Internal Medicine | Admitting: Internal Medicine

## 2023-07-26 DIAGNOSIS — B349 Viral infection, unspecified: Secondary | ICD-10-CM

## 2023-07-26 DIAGNOSIS — R0981 Nasal congestion: Secondary | ICD-10-CM | POA: Diagnosis not present

## 2023-07-26 NOTE — ED Provider Notes (Signed)
[facility]    CSN: [id number] Arrival date & time: 07/26/23  1802      History   Chief Complaint No chief complaint on file.   HPI Trevor Medina is a 52 y.o. male  presents for evaluation of URI symptoms for 3 days. Patient reports associated symptoms of nasal congestion/runny nose. Denies N/V/D, sore throat, cough, ear pain, body aches, shortness of breath. Patient does not have a hx of asthma. Patient does not have a history of smoking.  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  Pt has taken nothing OTC for symptoms. Pt has no other concerns at this time.   HPI  Past Medical History:  Diagnosis Date   Chronic back pain    Diabetes mellitus without complication (HCC)    no meds - was given Rx for Metformin in March 2016 at urgent care ([facility]) but never filled Rx. Planned to treat with "diet and exercise"   History of gout    HNP (herniated nucleus pulposus), lumbar    Hypertension    Spinal stenosis     Patient Active Problem List   Diagnosis Date Noted   Essential hypertension 09/14/2015   HNP (herniated nucleus pulposus), lumbar 07/07/2015   Uncontrolled diabetes mellitus (HCC) 07/07/2015   Obesity 07/07/2015    Past Surgical History:  Procedure Laterality Date   ANKLE SURGERY     LUMBAR LAMINECTOMY/DECOMPRESSION MICRODISCECTOMY Bilateral 07/07/2015   Procedure: DECOMPRESSION L5-S1 BILATERALLY;  Surgeon: Jene Every, MD;  Location: [facility];  Service: Orthopedics;  Laterality: Bilateral;       Home Medications    Prior to Admission medications   Medication Sig Start Date End Date Taking? Authorizing Provider  amLODipine (NORVASC) 5 MG tablet Take 1 tablet (5 mg total) by mouth at bedtime. 11/16/18   Nche, Bonna Gains, NP  blood glucose meter kit and supplies KIT Dispense based on patient and insurance preference. Use once a day before breakfast. E11.9 11/15/18   Nche, Bonna Gains, NP  cetirizine (ZYRTEC) 10 MG tablet Take 1 tablet  (10 mg total) by mouth daily. 05/31/23 06/30/23  Radford Pax, NP  dicyclomine (BENTYL) 20 MG tablet Take 1 tablet (20 mg total) by mouth 3 (three) times daily as needed for spasms. 04/05/23   Long, Arlyss Repress, MD  fluconazole (DIFLUCAN) 100 MG tablet 2 tablets by mouth the first day, then 1 tablet by mouth once daily for 6 more days. 02/11/23   Zenia Resides, MD  glipiZIDE (GLUCOTROL XL) 2.5 MG 24 hr tablet Take 1 tablet (2.5 mg total) by mouth daily with breakfast. 11/16/18   Nche, Bonna Gains, NP  hydrochlorothiazide (MICROZIDE) 12.5 MG capsule Take 1 capsule (12.5 mg total) by mouth daily. 11/16/18   Nche, Bonna Gains, NP  ipratropium (ATROVENT) 0.03 % nasal spray Place 2 sprays into both nostrils every 12 (twelve) hours. 05/31/23   Radford Pax, NP  ketoconazole (NIZORAL) 2 % cream Apply 1 Application topically daily. To affected area until healed, about 2 weeks. 02/11/23   Zenia Resides, MD  lidocaine (LIDODERM) 5 % Place 1 patch onto the skin daily. Remove & Discard patch within 12 hours or as directed by MD 10/20/20   Farrel Gordon, PA-C  metFORMIN (GLUCOPHAGE-XR) 500 MG 24 hr tablet Take 1 tablet (500 mg total) by mouth 2 (two) times daily after a meal. 11/16/18   Nche, Bonna Gains, NP  ondansetron (ZOFRAN-ODT) 4 MG disintegrating tablet Take 1 tablet (4 mg total) by  mouth every 8 (eight) hours as needed. 04/05/23   Long, Arlyss Repress, MD  pantoprazole (PROTONIX) 40 MG tablet Take 1 tablet (40 mg total) by mouth daily. 04/05/23 05/05/23  Long, Arlyss Repress, MD    Family History Family History  Problem Relation Age of Onset   Cancer Mother 7       ovarian cancer   Diabetes Father     Social History Social History   Tobacco Use   Smoking status: Never   Smokeless tobacco: Never  Vaping Use   Vaping status: Never Used  Substance Use Topics   Alcohol use: Yes    Alcohol/week: 2.0 standard drinks of alcohol    Types: 1 Cans of beer, 1 Shots of liquor per week    Comment: on weekend    Drug use: No     Allergies   Patient has no known allergies.   Review of Systems Review of Systems  HENT:  Positive for congestion and rhinorrhea.      Physical Exam Triage Vital Signs ED Triage Vitals [07/26/23 1809]  Encounter Vitals Group     BP (!) 143/81     Systolic BP Percentile      Diastolic BP Percentile      Pulse Rate 82     Resp 16     Temp 98.2 F (36.8 C)     Temp Source Oral     SpO2 97 %     Weight      Height      Head Circumference      Peak Flow      Pain Score      Pain Loc      Pain Education      Exclude from Growth Chart    No data found.  Updated Vital Signs BP (!) 143/81 (BP Location: Left Arm)   Pulse 82   Temp 98.2 F (36.8 C) (Oral)   Resp 16   SpO2 97%   Visual Acuity Right Eye Distance:   Left Eye Distance:   Bilateral Distance:    Right Eye Near:   Left Eye Near:    Bilateral Near:     Physical Exam Vitals and nursing note reviewed.  Constitutional:      General: He is not in acute distress.    Appearance: Normal appearance. He is not ill-appearing or toxic-appearing.  HENT:     Head: Normocephalic and atraumatic.     Right Ear: Tympanic membrane and ear canal normal.     Left Ear: Tympanic membrane and ear canal normal.     Nose: Congestion present.     Mouth/Throat:     Mouth: Mucous membranes are moist.     Pharynx: No posterior oropharyngeal erythema.  Eyes:     Pupils: Pupils are equal, round, and reactive to light.  Cardiovascular:     Rate and Rhythm: Normal rate and regular rhythm.     Heart sounds: Normal heart sounds.  Pulmonary:     Effort: Pulmonary effort is normal.     Breath sounds: Normal breath sounds.  Musculoskeletal:     Cervical back: Normal range of motion and neck supple.  Lymphadenopathy:     Cervical: No cervical adenopathy.  Skin:    General: Skin is warm and dry.  Neurological:     General: No focal deficit present.     Mental Status: He is alert and oriented to person,  place, and time.  Psychiatric:  Mood and Affect: Mood normal.        Behavior: Behavior normal.      UC Treatments / Results  Labs (all labs ordered are listed, but only abnormal results are displayed) Labs Reviewed - No data to display  EKG   Radiology No results found.  Procedures Procedures (including critical care time)  Medications Ordered in UC Medications - No data to display  Initial Impression / Assessment and Plan / UC Course  I have reviewed the triage vital signs and the nursing notes.  Pertinent labs & imaging results that were available during my care of the patient were reviewed by me and considered in my medical decision making (see chart for details).     Reviewed exam and symptoms with patient.  No red flags.  He declined COVID or flu testing.  Discussed likely viral illness and symptomatic treatment.  States he is a nasal spray at home he can use for his congestion.  PCP follow-up if symptoms do not improve.  ER precautions reviewed Final Clinical Impressions(s) / UC Diagnoses   Final diagnoses:  Nasal congestion  Viral illness     Discharge Instructions      Please treat your symptoms with over the counter cough medication, tylenol or ibuprofen, humidifier, and rest. Viral illnesses can last 7-14 days. Please follow up with your PCP if your symptoms are not improving. Please go to the ER for any worsening symptoms. This includes but is not limited to fever you can not control with tylenol or ibuprofen, you are not able to stay hydrated, you have shortness of breath or chest pain.  Thank you for choosing  for your healthcare needs. I hope you feel better soon!    ED Prescriptions   None    PDMP not reviewed this encounter.   Radford Pax, NP 07/26/23 1820

## 2023-07-26 NOTE — Discharge Instructions (Signed)
Please treat your symptoms with over the counter cough medication, tylenol or ibuprofen, humidifier, and rest. Viral illnesses can last 7-14 days. Please follow up with your PCP if your symptoms are not improving. Please go to the ER for any worsening symptoms. This includes but is not limited to fever you can not control with tylenol or ibuprofen, you are not able to stay hydrated, you have shortness of breath or chest pain.  Thank you for choosing [facility] for your healthcare needs. I hope you feel better soon!  

## 2023-07-26 NOTE — ED Triage Notes (Signed)
Patient c/o a cough, runny nose and nasal congestion x 3 day. No known fevers.

## 2023-09-14 IMAGING — CR DG SHOULDER 2+V*L*
3 series · 3 of 3 positions shown · non-contrast
Comparison: None.

CLINICAL DATA: Left shoulder pain for several days following heavy
lifting, initial encounter

EXAM:
LEFT SHOULDER - 2+ VIEW

[w shoulder grashey left]
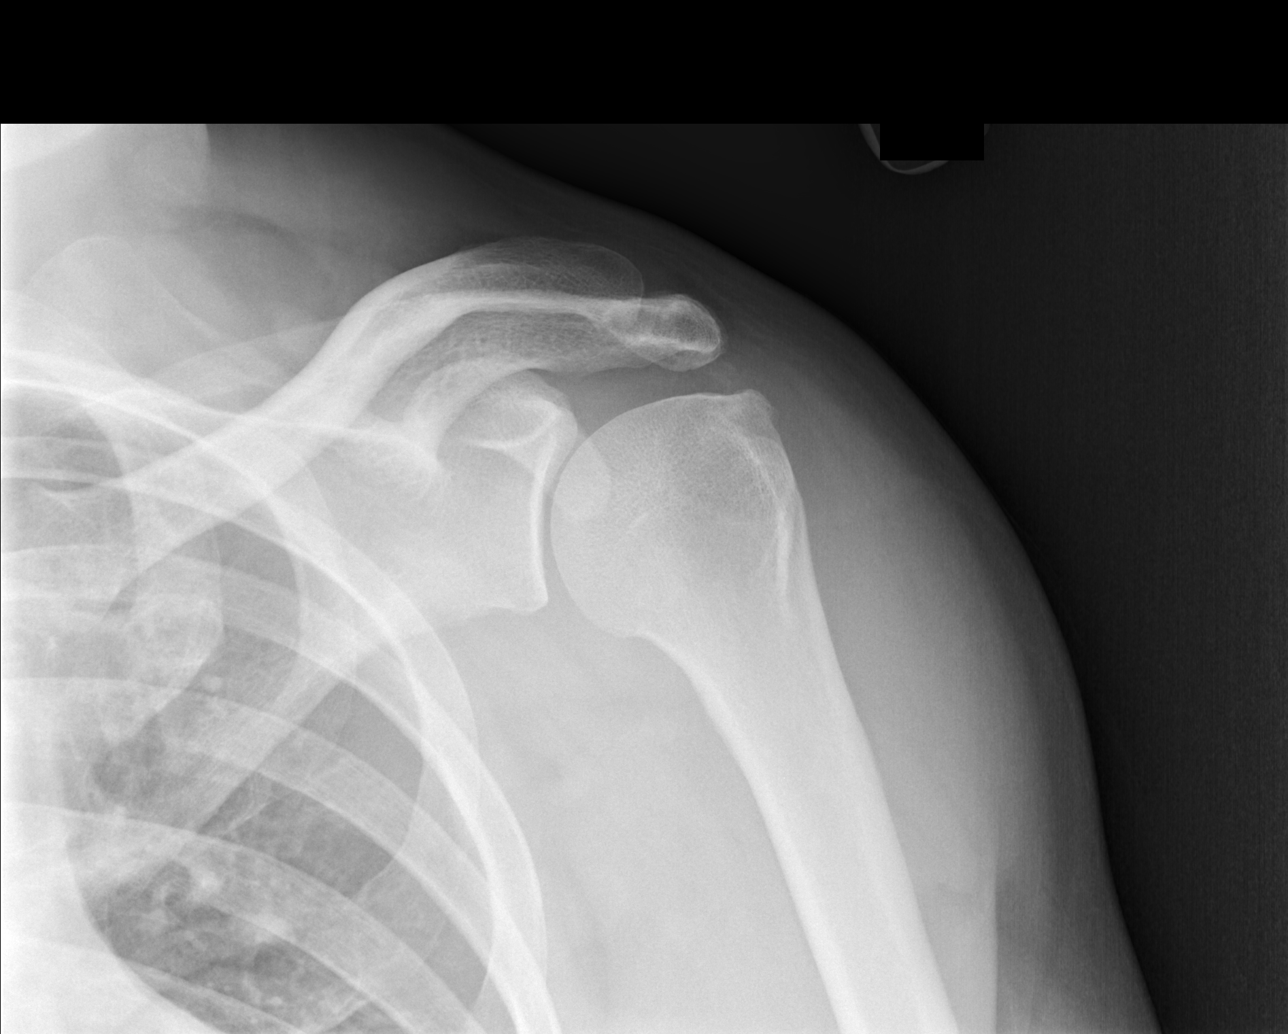

[w shoulder y view left]
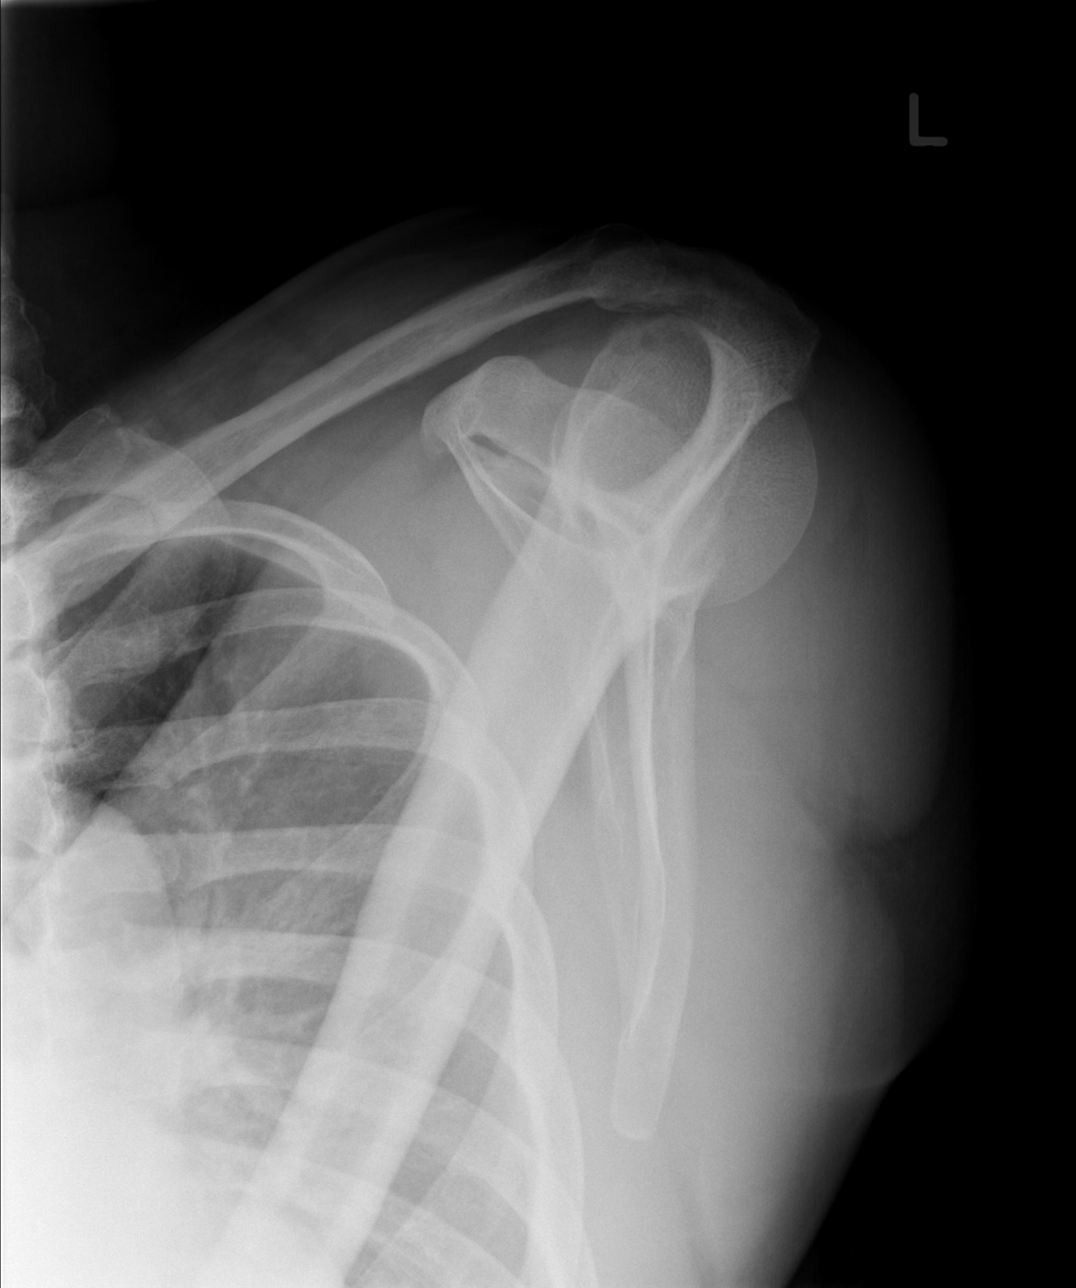

[x shoulder axillary left *]
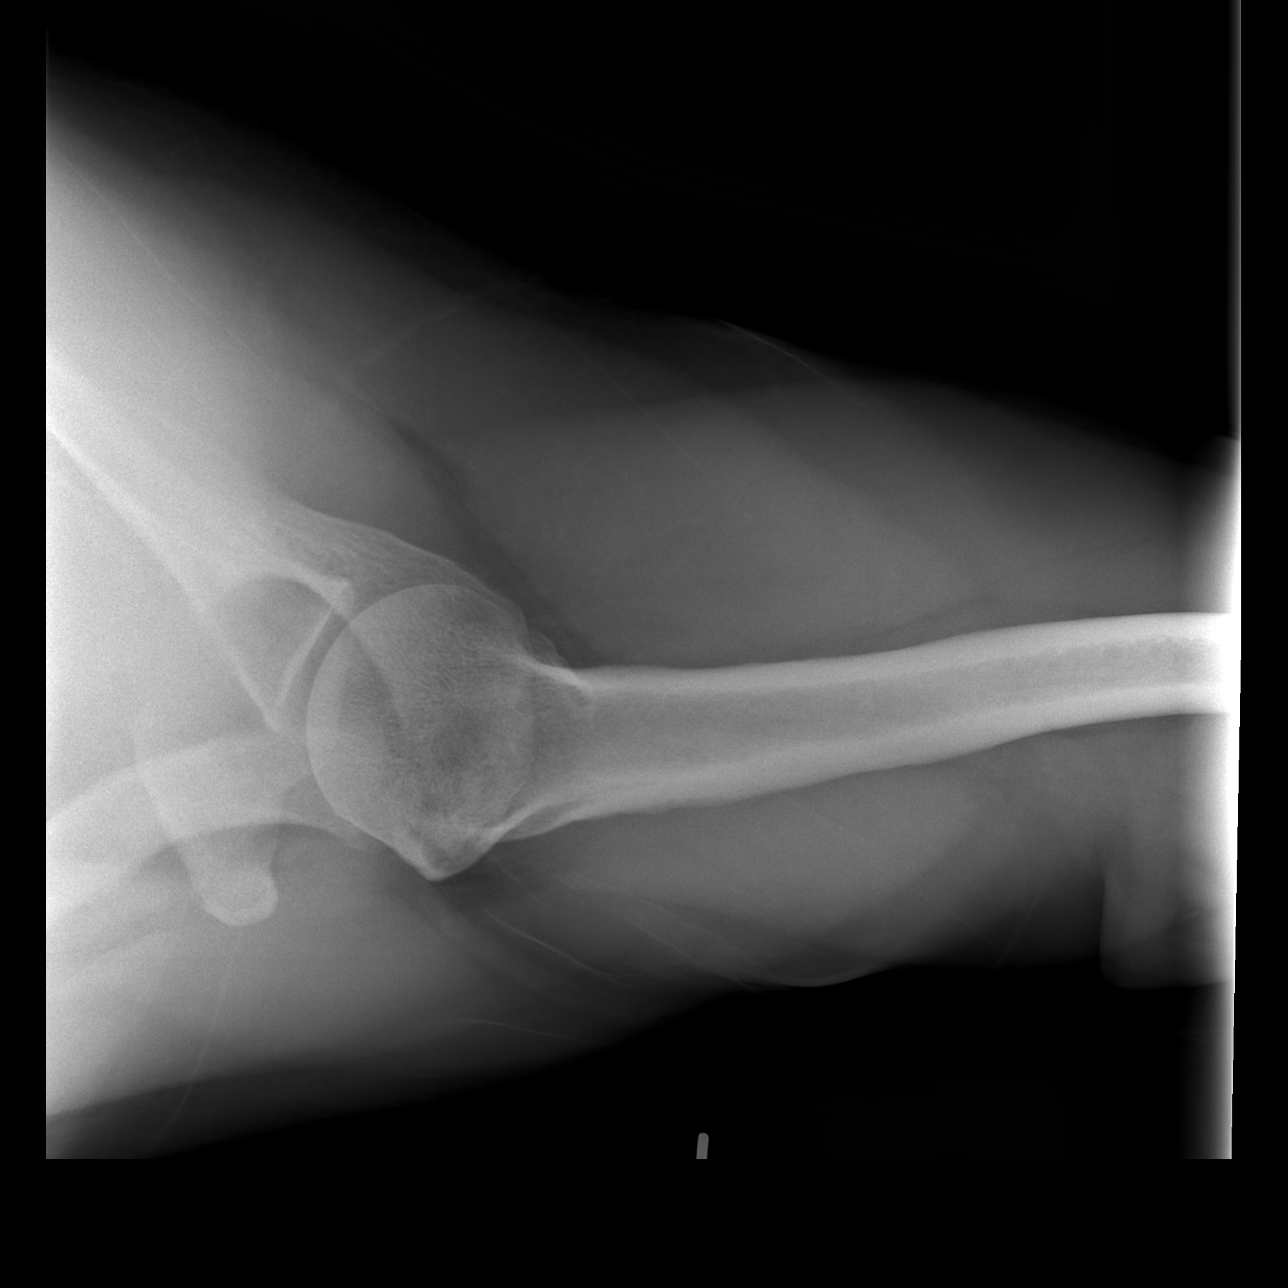

[3 of 3 positions shown; findings below may reference images not displayed]

FINDINGS: There is no evidence of fracture or dislocation. There is no
evidence of arthropathy or other focal bone abnormality. Soft
tissues are unremarkable.
IMPRESSION: No acute abnormality noted.

## 2023-10-24 ENCOUNTER — Other Ambulatory Visit: Payer: Self-pay

## 2023-10-24 ENCOUNTER — Emergency Department (HOSPITAL_COMMUNITY)
Admission: EM | Admit: 2023-10-24 | Discharge: 2023-10-25 | Disposition: A | Discharge status: Home / Self Care | Payer: Managed Care, Other (non HMO) | Source: Home / Self Care | Attending: Emergency Medicine | Admitting: Emergency Medicine

## 2023-10-24 ENCOUNTER — Encounter (HOSPITAL_COMMUNITY): Payer: Self-pay

## 2023-10-24 DIAGNOSIS — E119 Type 2 diabetes mellitus without complications: Secondary | ICD-10-CM | POA: Insufficient documentation

## 2023-10-24 DIAGNOSIS — Z79899 Other long term (current) drug therapy: Secondary | ICD-10-CM | POA: Insufficient documentation

## 2023-10-24 DIAGNOSIS — A419 Sepsis, unspecified organism: Secondary | ICD-10-CM | POA: Diagnosis not present

## 2023-10-24 DIAGNOSIS — J09X2 Influenza due to identified novel influenza A virus with other respiratory manifestations: Secondary | ICD-10-CM | POA: Insufficient documentation

## 2023-10-24 DIAGNOSIS — I10 Essential (primary) hypertension: Secondary | ICD-10-CM | POA: Insufficient documentation

## 2023-10-24 DIAGNOSIS — Z7984 Long term (current) use of oral hypoglycemic drugs: Secondary | ICD-10-CM | POA: Insufficient documentation

## 2023-10-24 DIAGNOSIS — Z20822 Contact with and (suspected) exposure to covid-19: Secondary | ICD-10-CM | POA: Insufficient documentation

## 2023-10-24 DIAGNOSIS — J101 Influenza due to other identified influenza virus with other respiratory manifestations: Secondary | ICD-10-CM

## 2023-10-24 DIAGNOSIS — D72825 Bandemia: Secondary | ICD-10-CM | POA: Diagnosis not present

## 2023-10-24 LAB — RESP PANEL BY RT-PCR (RSV, FLU A&B, COVID)  RVPGX2
Influenza A by PCR: POSITIVE — AB
Influenza B by PCR: NEGATIVE
Resp Syncytial Virus by PCR: NEGATIVE
SARS Coronavirus 2 by RT PCR: NEGATIVE

## 2023-10-24 MED ORDER — ACETAMINOPHEN 500 MG PO TABS
1000.0000 mg | ORAL_TABLET | ORAL | Status: AC
  Administered 2023-10-24: 1000 mg via ORAL
  Filled 2023-10-24: qty 2

## 2023-10-24 NOTE — ED Triage Notes (Addendum)
Pt arrived POV for productive cough for 3-4 days with dry throat, denies nasal congestion, or fevers. Has attempted Mucinex OTC & alkerzer plus with some relief Denies SOB or CP.  Febrile at current, otherwise A&Ox4. NAD noted. Pt is also here with adult son that is also being seen for cough

## 2023-10-25 ENCOUNTER — Encounter (HOSPITAL_COMMUNITY): Payer: Self-pay | Admitting: Emergency Medicine

## 2023-10-25 ENCOUNTER — Emergency Department (HOSPITAL_COMMUNITY): Payer: Managed Care, Other (non HMO)

## 2023-10-25 ENCOUNTER — Other Ambulatory Visit: Payer: Self-pay

## 2023-10-25 ENCOUNTER — Inpatient Hospital Stay (HOSPITAL_COMMUNITY)
Admission: EM | Admit: 2023-10-25 | Discharge: 2023-10-29 | DRG: 871 | Disposition: A | Discharge status: Home / Self Care | Payer: Managed Care, Other (non HMO) | Attending: Internal Medicine | Admitting: Internal Medicine

## 2023-10-25 DIAGNOSIS — D72825 Bandemia: Secondary | ICD-10-CM | POA: Diagnosis present

## 2023-10-25 DIAGNOSIS — N179 Acute kidney failure, unspecified: Secondary | ICD-10-CM | POA: Diagnosis present

## 2023-10-25 DIAGNOSIS — Z7984 Long term (current) use of oral hypoglycemic drugs: Secondary | ICD-10-CM

## 2023-10-25 DIAGNOSIS — Z833 Family history of diabetes mellitus: Secondary | ICD-10-CM

## 2023-10-25 DIAGNOSIS — J1000 Influenza due to other identified influenza virus with unspecified type of pneumonia: Secondary | ICD-10-CM | POA: Diagnosis present

## 2023-10-25 DIAGNOSIS — E860 Dehydration: Secondary | ICD-10-CM | POA: Diagnosis present

## 2023-10-25 DIAGNOSIS — E785 Hyperlipidemia, unspecified: Secondary | ICD-10-CM | POA: Diagnosis present

## 2023-10-25 DIAGNOSIS — R17 Unspecified jaundice: Secondary | ICD-10-CM | POA: Diagnosis present

## 2023-10-25 DIAGNOSIS — E66811 Obesity, class 1: Secondary | ICD-10-CM | POA: Diagnosis present

## 2023-10-25 DIAGNOSIS — J189 Pneumonia, unspecified organism: Principal | ICD-10-CM | POA: Diagnosis present

## 2023-10-25 DIAGNOSIS — A419 Sepsis, unspecified organism: Principal | ICD-10-CM | POA: Diagnosis present

## 2023-10-25 DIAGNOSIS — A084 Viral intestinal infection, unspecified: Secondary | ICD-10-CM | POA: Diagnosis present

## 2023-10-25 DIAGNOSIS — J188 Other pneumonia, unspecified organism: Secondary | ICD-10-CM | POA: Diagnosis present

## 2023-10-25 DIAGNOSIS — D509 Iron deficiency anemia, unspecified: Secondary | ICD-10-CM | POA: Diagnosis present

## 2023-10-25 DIAGNOSIS — I10 Essential (primary) hypertension: Secondary | ICD-10-CM | POA: Diagnosis present

## 2023-10-25 DIAGNOSIS — Z6830 Body mass index (BMI) 30.0-30.9, adult: Secondary | ICD-10-CM | POA: Diagnosis not present

## 2023-10-25 DIAGNOSIS — E1165 Type 2 diabetes mellitus with hyperglycemia: Secondary | ICD-10-CM | POA: Diagnosis present

## 2023-10-25 LAB — CBC WITH DIFFERENTIAL/PLATELET
Abs Immature Granulocytes: 0 10*3/uL (ref 0.00–0.07)
Basophils Absolute: 0 10*3/uL (ref 0.0–0.1)
Basophils Relative: 0 %
Eosinophils Absolute: 0 10*3/uL (ref 0.0–0.5)
Eosinophils Relative: 0 %
HCT: 36.7 % — ABNORMAL LOW (ref 39.0–52.0)
Hemoglobin: 12.6 g/dL — ABNORMAL LOW (ref 13.0–17.0)
Lymphocytes Relative: 6 %
Lymphs Abs: 1 10*3/uL (ref 0.7–4.0)
MCH: 26.3 pg (ref 26.0–34.0)
MCHC: 34.3 g/dL (ref 30.0–36.0)
MCV: 76.6 fL — ABNORMAL LOW (ref 80.0–100.0)
Monocytes Absolute: 0 10*3/uL — ABNORMAL LOW (ref 0.1–1.0)
Monocytes Relative: 0 %
Neutro Abs: 15 10*3/uL — ABNORMAL HIGH (ref 1.7–7.7)
Neutrophils Relative %: 94 %
Platelets: 214 10*3/uL (ref 150–400)
RBC: 4.79 MIL/uL (ref 4.22–5.81)
RDW: 13.8 % (ref 11.5–15.5)
WBC: 16 10*3/uL — ABNORMAL HIGH (ref 4.0–10.5)
nRBC: 0 % (ref 0.0–0.2)
nRBC: 0 /100{WBCs}

## 2023-10-25 LAB — COMPREHENSIVE METABOLIC PANEL WITH GFR
ALT: 23 U/L (ref 0–44)
AST: 25 U/L (ref 15–41)
Albumin: 2 g/dL — ABNORMAL LOW (ref 3.5–5.0)
Alkaline Phosphatase: 94 U/L (ref 38–126)
Anion gap: 11 (ref 5–15)
BUN: 34 mg/dL — ABNORMAL HIGH (ref 6–20)
CO2: 27 mmol/L (ref 22–32)
Calcium: 7.6 mg/dL — ABNORMAL LOW (ref 8.9–10.3)
Chloride: 97 mmol/L — ABNORMAL LOW (ref 98–111)
Creatinine, Ser: 2.76 mg/dL — ABNORMAL HIGH (ref 0.61–1.24)
GFR, Estimated: 27 mL/min — ABNORMAL LOW
Glucose, Bld: 148 mg/dL — ABNORMAL HIGH (ref 70–99)
Potassium: 3.8 mmol/L (ref 3.5–5.1)
Sodium: 135 mmol/L (ref 135–145)
Total Bilirubin: 1.7 mg/dL — ABNORMAL HIGH (ref 0.0–1.2)
Total Protein: 6.5 g/dL (ref 6.5–8.1)

## 2023-10-25 LAB — I-STAT CG4 LACTIC ACID, ED: Lactic Acid, Venous: 1.5 mmol/L (ref 0.5–1.9)

## 2023-10-25 LAB — TROPONIN I (HIGH SENSITIVITY)
Troponin I (High Sensitivity): 16 ng/L (ref <18)
Troponin I (High Sensitivity): 20 ng/L — ABNORMAL HIGH (ref <18)

## 2023-10-25 MED ORDER — PROCHLORPERAZINE EDISYLATE 10 MG/2ML IJ SOLN
5.0000 mg | Freq: Four times a day (QID) | INTRAMUSCULAR | Status: DC | PRN
  Administered 2023-10-25: 5 mg via INTRAVENOUS
  Filled 2023-10-25: qty 2

## 2023-10-25 MED ORDER — SODIUM CHLORIDE 0.9 % IV SOLN
500.0000 mg | Freq: Once | INTRAVENOUS | Status: AC
  Administered 2023-10-25: 500 mg via INTRAVENOUS
  Filled 2023-10-25: qty 5

## 2023-10-25 MED ORDER — LACTATED RINGERS IV SOLN: INTRAVENOUS | Status: DC

## 2023-10-25 MED ORDER — SODIUM CHLORIDE 0.9 % IV SOLN
2.0000 g | Freq: Once | INTRAVENOUS | Status: AC
  Administered 2023-10-25: 2 g via INTRAVENOUS
  Filled 2023-10-25: qty 20

## 2023-10-25 MED ORDER — LACTATED RINGERS IV BOLUS
30.0000 mL/kg | Freq: Once | INTRAVENOUS | Status: AC
  Administered 2023-10-25: 2190 mL via INTRAVENOUS

## 2023-10-25 MED ORDER — ACETAMINOPHEN 325 MG PO TABS
650.0000 mg | ORAL_TABLET | Freq: Once | ORAL | Status: AC
  Administered 2023-10-25: 650 mg via ORAL
  Filled 2023-10-25: qty 2

## 2023-10-25 MED ORDER — OXYCODONE HCL 5 MG PO TABS
5.0000 mg | ORAL_TABLET | Freq: Four times a day (QID) | ORAL | Status: DC | PRN
  Administered 2023-10-25: 5 mg via ORAL
  Filled 2023-10-25: qty 1

## 2023-10-25 MED ORDER — IBUPROFEN 800 MG PO TABS: 800.0000 mg | ORAL_TABLET | Freq: Once | ORAL | Status: DC

## 2023-10-25 NOTE — ED Notes (Signed)
Pt arrived to [facility] from [facility], NAD noted, A&O x4.

## 2023-10-25 NOTE — ED Provider Notes (Signed)
[place] [facility] AT [facility] Provider Note   CSN: [id number] Arrival date & time: 10/24/23  2029     History  Chief Complaint  Patient presents with   Cough    Trevor Medina is a 53 y.o. male.  53 year old male presents to the emergency department for flu-like symptoms for the past 4 days.  He reports sore throat as well as a cough with fatigue and malaise.  Has been using Mucinex over-the-counter and Alka-Seltzer plus with mild improvement.  Son is sick with similar symptoms.  Noted to be febrile in triage, but with no known documented fevers prior to arrival.  Denies vomiting, diarrhea.  The history is provided by the patient. No language interpreter was used.  Cough      Home Medications Prior to Admission medications   Medication Sig Start Date End Date Taking? Authorizing Provider  amLODipine (NORVASC) 5 MG tablet Take 1 tablet (5 mg total) by mouth at bedtime. 11/16/18   Nche, Bonna Gains, NP  blood glucose meter kit and supplies KIT Dispense based on patient and insurance preference. Use once a day before breakfast. E11.9 11/15/18   Nche, Bonna Gains, NP  cetirizine (ZYRTEC) 10 MG tablet Take 1 tablet (10 mg total) by mouth daily. 05/31/23 06/30/23  Radford Pax, NP  dicyclomine (BENTYL) 20 MG tablet Take 1 tablet (20 mg total) by mouth 3 (three) times daily as needed for spasms. 04/05/23   Long, Arlyss Repress, MD  fluconazole (DIFLUCAN) 100 MG tablet 2 tablets by mouth the first day, then 1 tablet by mouth once daily for 6 more days. 02/11/23   Zenia Resides, MD  glipiZIDE (GLUCOTROL XL) 2.5 MG 24 hr tablet Take 1 tablet (2.5 mg total) by mouth daily with breakfast. 11/16/18   Nche, Bonna Gains, NP  hydrochlorothiazide (MICROZIDE) 12.5 MG capsule Take 1 capsule (12.5 mg total) by mouth daily. 11/16/18   Nche, Bonna Gains, NP  ipratropium (ATROVENT) 0.03 % nasal spray Place 2 sprays into both nostrils every 12 (twelve) hours. 05/31/23   Radford Pax, NP  ketoconazole (NIZORAL) 2 % cream Apply 1 Application topically daily. To affected area until healed, about 2 weeks. 02/11/23   Zenia Resides, MD  lidocaine (LIDODERM) 5 % Place 1 patch onto the skin daily. Remove & Discard patch within 12 hours or as directed by MD 10/20/20   Farrel Gordon, PA-C  metFORMIN (GLUCOPHAGE-XR) 500 MG 24 hr tablet Take 1 tablet (500 mg total) by mouth 2 (two) times daily after a meal. 11/16/18   Nche, Bonna Gains, NP  ondansetron (ZOFRAN-ODT) 4 MG disintegrating tablet Take 1 tablet (4 mg total) by mouth every 8 (eight) hours as needed. 04/05/23   Long, Arlyss Repress, MD  pantoprazole (PROTONIX) 40 MG tablet Take 1 tablet (40 mg total) by mouth daily. 04/05/23 05/05/23  Long, Arlyss Repress, MD      Allergies    Patient has no known allergies.    Review of Systems   Review of Systems  Respiratory:  Positive for cough.   Ten systems reviewed and are negative for acute change, except as noted in the HPI.    Physical Exam Updated Vital Signs BP 110/76   Pulse 89   Temp 98.4 F (36.9 C)   Resp 18   Ht 5\' 10"  (1.778 m)   Wt 95.3 kg   SpO2 99%   BMI 30.13 kg/m   Physical Exam Vitals and nursing note reviewed.  Constitutional:      General: He is not in acute distress.    Appearance: He is well-developed. He is not diaphoretic.     Comments: Nontoxic appearing and in NAD  HENT:     Head: Normocephalic and atraumatic.  Eyes:     General: No scleral icterus.    Conjunctiva/sclera: Conjunctivae normal.  Cardiovascular:     Rate and Rhythm: Normal rate and regular rhythm.     Pulses: Normal pulses.     Comments: Not tachycardic as noted in triage Pulmonary:     Effort: Pulmonary effort is normal. No respiratory distress.     Breath sounds: No stridor. No wheezing.     Comments: Lungs CTAB. Respirations even and unlabored. Musculoskeletal:        General: Normal range of motion.     Cervical back: Normal range of motion.  Skin:    General: Skin is  warm and dry.     Coloration: Skin is not pale.     Findings: No erythema or rash.  Neurological:     Mental Status: He is alert and oriented to person, place, and time.  Psychiatric:        Behavior: Behavior normal.     ED Results / Procedures / Treatments   Labs (all labs ordered are listed, but only abnormal results are displayed) Labs Reviewed  RESP PANEL BY RT-PCR (RSV, FLU A&B, COVID)  [id number] - Abnormal; Notable for the following components:      Result Value   Influenza A by PCR POSITIVE (*)    All other components within normal limits    EKG None  Radiology No results found.  Procedures Procedures    Medications Ordered in ED Medications  ibuprofen (ADVIL) tablet 800 mg (has no administration in time range)  acetaminophen (TYLENOL) tablet 1,000 mg (1,000 mg Oral Given 10/24/23 2040)    ED Course/ Medical Decision Making/ A&P                                 Medical Decision Making Risk OTC drugs. Prescription drug management.   This patient presents to the ED for concern of flu-like symptoms, this involves an extensive number of treatment options, and is a complaint that carries with it a high risk of complications and morbidity.  The differential diagnosis includes viral illness vs PNA vs bronchitis vs empyema    Co morbidities that complicate the patient evaluation  HTN DM   Lab Tests:  I Ordered, and personally interpreted labs.  The pertinent results include:  Influenza A positive   Cardiac Monitoring:  The patient was maintained on a cardiac monitor.  I personally viewed and interpreted the cardiac monitored which showed an underlying rhythm of: sinus tachycardia > NSR   Medicines ordered and prescription drug management:  I ordered medication including Tylenol and ibuprofen for fever, body aches  Reevaluation of the patient after these medicines showed that the patient improved I have reviewed the patients home medicines and have made  adjustments as needed   Test Considered:  CXR   Problem List / ED Course:  Presenting for flu-like symptoms and testing positive for influenza A. No hypoxemia, increased WOB. Febrile and tachycardic in triage; defervesced with antipyretics. Encouraged ongoing PO fluid intake. Out of the window for Tamiflu. Stable for f/u with PCP for recheck.   Reevaluation:  After the interventions noted above, I reevaluated the patient  and found that they have :stayed the same   Dispostion:  After consideration of the diagnostic results and the patients response to treatment, I feel that the patent would benefit from supportive care measures and PCP f/u. Return precautions discussed and provided. Patient discharged in stable condition with no unaddressed concerns.          Final Clinical Impression(s) / ED Diagnoses Final diagnoses:  Influenza A    Rx / DC Orders ED Discharge Orders     None         Antony Madura, PA-C 10/25/23 0150    Dione Booze, MD 10/25/23 [phone]

## 2023-10-25 NOTE — Discharge Instructions (Addendum)
You have tested positive for the flu today. Take 1000mg  tylenol every 8 hours for fever, headaches. You may use 600mg  ibuprofen/Advil/Motrin every 6 hours for body aches. Drink plenty of fluids to prevent dehydration. Continue to use other over-the-counter remedies for symptom control, if desired. Return for new or concerning symptoms such as worsening shortness of breath, coughing up blood, persistent vomiting, loss of consciousness.

## 2023-10-25 NOTE — ED Triage Notes (Signed)
Pt via POV c/o flu symptoms x 2-3 days and says he can "barely walk." Notes that his PCP advised he come to ER for admission. Reports poor appetite x 5 days, body aches, weakness, cough, fever, diarrhea.

## 2023-10-25 NOTE — ED Provider Notes (Signed)
[place] EMERGENCY DEPARTMENT AT [facility] Provider Note  CSN: [id number] Arrival date & time: 10/25/23 1351  Chief Complaint(s) Influenza  HPI Trevor Medina is a 53 y.o. male history of diabetes, hypertension presenting to the emergency department with weakness.  Patient reports that he is about 5 days of generalized weakness, fatigue, decreased p.o. intake, cough, mild shortness of breath.  No abdominal pain.  He reports fevers and chills at home.  Also reports diarrhea.  Was here in the emergency department yesterday, diagnosed with influenza discharged home.  He reports that his symptoms have worsened and he came back for reevaluation.   Past Medical History Past Medical History:  Diagnosis Date   Chronic back pain    Diabetes mellitus without complication (HCC)    no meds - was given Rx for Metformin in March 2016 at urgent care ([facility]) but never filled Rx. Planned to treat with "diet and exercise"   History of gout    HNP (herniated nucleus pulposus), lumbar    Hypertension    Spinal stenosis    Patient Active Problem List   Diagnosis Date Noted   Multifocal pneumonia 10/25/2023   Essential hypertension 09/14/2015   HNP (herniated nucleus pulposus), lumbar 07/07/2015   Uncontrolled diabetes mellitus (HCC) 07/07/2015   Obesity 07/07/2015   Home Medication(s) Prior to Admission medications   Medication Sig Start Date End Date Taking? Authorizing Provider  amLODipine-olmesartan (AZOR) 5-20 MG tablet Take 1 tablet by mouth daily.   Yes [provider]  metFORMIN (GLUCOPHAGE-XR) 500 MG 24 hr tablet Take 1 tablet (500 mg total) by mouth 2 (two) times daily after a meal. 11/16/18  Yes Nche, Bonna Gains, NP  Phenyleph-Doxylamine-DM-APAP (ALKA-SELTZER PLUS COLD & FLU) 10-12.5-20-650 MG PACK Take 1 Dose by mouth every 6 (six) hours as needed (cold and flu symptoms).   Yes [provider]  rosuvastatin (CRESTOR) 20 MG tablet Take 20 mg by mouth  daily. 10/02/23  Yes [provider]                                                                                                                                    Past Surgical History Past Surgical History:  Procedure Laterality Date   ANKLE SURGERY     LUMBAR LAMINECTOMY/DECOMPRESSION MICRODISCECTOMY Bilateral 07/07/2015   Procedure: DECOMPRESSION L5-S1 BILATERALLY;  Surgeon: Jene Every, MD;  Location: [facility];  Service: Orthopedics;  Laterality: Bilateral;   Family History Family History  Problem Relation Age of Onset   Cancer Mother 70       ovarian cancer   Diabetes Father     Social History Social History   Tobacco Use   Smoking status: Never   Smokeless tobacco: Never  Vaping Use   Vaping status: Never Used  Substance Use Topics   Alcohol use: Yes    Alcohol/week: 2.0 standard drinks of alcohol    Types: 1 Cans  of beer, 1 Shots of liquor per week    Comment: on weekend   Drug use: No   Allergies Patient has no known allergies.  Review of Systems Review of Systems  All other systems reviewed and are negative.   Physical Exam Vital Signs  I have reviewed the triage vital signs BP (!) 150/88 (BP Location: Right Arm)   Pulse (!) 102   Temp 99.6 F (37.6 C) (Oral)   Resp (!) 28   Ht 5\' 10"  (1.778 m)   Wt 95.3 kg   SpO2 94%   BMI 30.13 kg/m  Physical Exam Vitals and nursing note reviewed.  Constitutional:      General: He is not in acute distress.    Appearance: Normal appearance.  HENT:     Mouth/Throat:     Mouth: Mucous membranes are dry.  Eyes:     Conjunctiva/sclera: Conjunctivae normal.  Cardiovascular:     Rate and Rhythm: Normal rate and regular rhythm.  Pulmonary:     Effort: Pulmonary effort is normal. No respiratory distress.     Breath sounds: Normal breath sounds.  Abdominal:     General: Abdomen is flat.     Palpations: Abdomen is soft.     Tenderness: There is no abdominal tenderness.  Musculoskeletal:     Right  lower leg: No edema.     Left lower leg: No edema.  Skin:    General: Skin is warm and dry.     Capillary Refill: Capillary refill takes less than 2 seconds.  Neurological:     Mental Status: He is alert and oriented to person, place, and time. Mental status is at baseline.  Psychiatric:        Mood and Affect: Mood normal.        Behavior: Behavior normal.     ED Results and Treatments Labs (all labs ordered are listed, but only abnormal results are displayed) Labs Reviewed  CBC WITH DIFFERENTIAL/PLATELET - Abnormal; Notable for the following components:      Result Value   WBC 16.0 (*)    Hemoglobin 12.6 (*)    HCT 36.7 (*)    MCV 76.6 (*)    Neutro Abs 15.0 (*)    Monocytes Absolute 0.0 (*)    All other components within normal limits  COMPREHENSIVE METABOLIC PANEL - Abnormal; Notable for the following components:   Chloride 97 (*)    Glucose, Bld 148 (*)    BUN 34 (*)    Creatinine, Ser 2.76 (*)    Calcium 7.6 (*)    Albumin 2.0 (*)    Total Bilirubin 1.7 (*)    GFR, Estimated 27 (*)    All other components within normal limits  TROPONIN I (HIGH SENSITIVITY) - Abnormal; Notable for the following components:   Troponin I (High Sensitivity) 20 (*)    All other components within normal limits  CULTURE, BLOOD (ROUTINE X 2)  CULTURE, BLOOD (ROUTINE X 2)  CULTURE, BLOOD (SINGLE)  I-STAT CG4 LACTIC ACID, ED  I-STAT CG4 LACTIC ACID, ED  TROPONIN I (HIGH SENSITIVITY)  Radiology DG Chest 2 View Result Date: 10/25/2023 CLINICAL DATA:  Chest pain, flu like symptoms for 2-3 days EXAM: CHEST - 2 VIEW COMPARISON:  12/22/2014 FINDINGS: Frontal and lateral views of the chest demonstrate an unremarkable cardiac silhouette. There is patchy airspace disease in the perihilar regions bilaterally, with dense left lower lobe consolidation seen in the retrocardiac  region. No effusion or pneumothorax. No acute bony abnormalities. IMPRESSION: 1. Multifocal pneumonia, most pronounced within the left lower lobe. Electronically Signed   By: Sharlet Salina M.D.   On: 10/25/2023 16:33    Pertinent labs & imaging results that were available during my care of the patient were reviewed by me and considered in my medical decision making (see MDM for details).  Medications Ordered in ED Medications  lactated ringers infusion ( Intravenous New Bag/Given 10/25/23 2143)  prochlorperazine (COMPAZINE) injection 5 mg (5 mg Intravenous Given 10/25/23 2319)  oxyCODONE (Oxy IR/ROXICODONE) immediate release tablet 5 mg (5 mg Oral Given 10/25/23 2319)  acetaminophen (TYLENOL) tablet 650 mg (650 mg Oral Given 10/25/23 1614)  cefTRIAXone (ROCEPHIN) 2 g in sodium chloride 0.9 % 100 mL IVPB (0 g Intravenous Stopped 10/25/23 2341)  azithromycin (ZITHROMAX) 500 mg in sodium chloride 0.9 % 250 mL IVPB (0 mg Intravenous Stopped 10/25/23 2341)  lactated ringers bolus 2,190 mL (0 mLs Intravenous Stopped 10/25/23 2341)                                                                                                                                     Procedures .Critical Care  Performed by: Lonell Grandchild, MD Authorized by: Lonell Grandchild, MD   Critical care provider statement:    Critical care time (minutes):  30   Critical care was necessary to treat or prevent imminent or life-threatening deterioration of the following conditions:  Sepsis and dehydration   Critical care was time spent personally by me on the following activities:  Development of treatment plan with patient or surrogate, discussions with consultants, evaluation of patient's response to treatment, examination of patient, ordering and review of laboratory studies, ordering and review of radiographic studies, ordering and performing treatments and interventions, pulse oximetry, re-evaluation of patient's condition and  review of old charts   (including critical care time)  Medical Decision Making / ED Course   MDM:  53 year old male presenting to the emergency department with generalized weakness.  Patient is overall well-appearing, vital signs notable for tachycardia, fever.  Was diagnosed with influenza yesterday.  Symptoms overall seem consistent with influenza, however, laboratory testing notable for acute kidney injury, patient does appear dehydrated.  His chest x-ray is also concerning for possible multifocal pneumonia.  He also has a leukocytosis with a bandemia concerning for underlying bacterial infection.  Patient will need to be admitted for further management of his symptoms.  Will defer Tamiflu to patient's hospitalist.  Patient received IV fluid bolus for AKI.  Additional history obtained: -Additional history obtained from friend -External records from outside source obtained and reviewed including: Chart review including previous notes, labs, imaging, consultation notes including prior ER visit   Lab Tests: -I ordered, reviewed, and interpreted labs.   The pertinent results include:   Labs Reviewed  CBC WITH DIFFERENTIAL/PLATELET - Abnormal; Notable for the following components:      Result Value   WBC 16.0 (*)    Hemoglobin 12.6 (*)    HCT 36.7 (*)    MCV 76.6 (*)    Neutro Abs 15.0 (*)    Monocytes Absolute 0.0 (*)    All other components within normal limits  COMPREHENSIVE METABOLIC PANEL - Abnormal; Notable for the following components:   Chloride 97 (*)    Glucose, Bld 148 (*)    BUN 34 (*)    Creatinine, Ser 2.76 (*)    Calcium 7.6 (*)    Albumin 2.0 (*)    Total Bilirubin 1.7 (*)    GFR, Estimated 27 (*)    All other components within normal limits  TROPONIN I (HIGH SENSITIVITY) - Abnormal; Notable for the following components:   Troponin I (High Sensitivity) 20 (*)    All other components within normal limits  CULTURE, BLOOD (ROUTINE X 2)  CULTURE, BLOOD  (ROUTINE X 2)  CULTURE, BLOOD (SINGLE)  I-STAT CG4 LACTIC ACID, ED  I-STAT CG4 LACTIC ACID, ED  TROPONIN I (HIGH SENSITIVITY)    Notable for AKI, bandemia    Imaging Studies ordered: I ordered imaging studies including CXR On my interpretation imaging demonstrates pneumonia I independently visualized and interpreted imaging. I agree with the radiologist interpretation   Medicines ordered and prescription drug management: Meds ordered this encounter  Medications   acetaminophen (TYLENOL) tablet 650 mg   cefTRIAXone (ROCEPHIN) 2 g in sodium chloride 0.9 % 100 mL IVPB    Antibiotic Indication::   CAP   azithromycin (ZITHROMAX) 500 mg in sodium chloride 0.9 % 250 mL IVPB   lactated ringers infusion   lactated ringers bolus 2,190 mL   prochlorperazine (COMPAZINE) injection 5 mg   oxyCODONE (Oxy IR/ROXICODONE) immediate release tablet 5 mg    Refill:  0    -I have reviewed the patients home medicines and have made adjustments as needed   Consultations Obtained: I requested consultation with the hospitalist,  and discussed lab and imaging findings as well as pertinent plan - they recommend: admission   Cardiac Monitoring: The patient was maintained on a cardiac monitor.  I personally viewed and interpreted the cardiac monitored which showed an underlying rhythm of: sinus tachycardia   Social Determinants of Health:  Diagnosis or treatment significantly limited by social determinants of health: obesity   Reevaluation: After the interventions noted above, I reevaluated the patient and found that their symptoms have improved  Co morbidities that complicate the patient evaluation  Past Medical History:  Diagnosis Date   Chronic back pain    Diabetes mellitus without complication (HCC)    no meds - was given Rx for Metformin in March 2016 at urgent care ([facility]) but never filled Rx. Planned to treat with "diet and exercise"   History of gout    HNP (herniated nucleus  pulposus), lumbar    Hypertension    Spinal stenosis       Dispostion: Disposition decision including need for hospitalization was considered, and patient admitted to the hospital.    Final Clinical Impression(s) / ED Diagnoses Final diagnoses:  Multifocal  pneumonia  Bandemia  AKI (acute kidney injury) (HCC)     This chart was dictated using voice recognition software.  Despite best efforts to proofread,  errors can occur which can change the documentation meaning.    Lonell Grandchild, MD 10/25/23 [phone]

## 2023-10-25 NOTE — Sepsis Progress Note (Signed)
Elink monitoring for the code sepsis protocol.  

## 2023-10-25 NOTE — ED Provider Triage Note (Signed)
Emergency Medicine Provider Triage Evaluation Note  Kosisochukwu Blatter , a 53 y.o. male  was evaluated in triage.  Pt complains of chest pain shortness of breath.  Patient was seen yesterday and diagnosed with the flu.  He states "it is worse.  He" he also states "I called my doctor and they told me I needed to come here to be admitted."  He complains of chest pain and back pain..  Review of Systems  Positive: Influenza and chest pain Negative: Vomiting  Physical Exam  BP (!) 150/77 (BP Location: Right Arm)   Pulse (!) 110   Temp (!) 100.8 F (38.2 C)   Resp 20   Ht 5\' 10"  (1.778 m)   Wt 95.3 kg   SpO2 94%   BMI 30.13 kg/m  Gen:   Awake, no distress   Resp:  Normal effort  MSK:   Moves extremities without difficulty  Other:    Medical Decision Making  Medically screening exam initiated at 4:11 PM.  Appropriate orders placed.  Gentry Kroner was informed that the remainder of the evaluation will be completed by another provider, this initial triage assessment does not replace that evaluation, and the importance of remaining in the ED until their evaluation is complete.     Arthor Captain, PA-C 10/25/23 [phone]

## 2023-10-26 DIAGNOSIS — J189 Pneumonia, unspecified organism: Secondary | ICD-10-CM | POA: Diagnosis not present

## 2023-10-26 LAB — PHOSPHORUS: Phosphorus: 3.4 mg/dL (ref 2.5–4.6)

## 2023-10-26 LAB — CBC
HCT: 33.1 % — ABNORMAL LOW (ref 39.0–52.0)
Hemoglobin: 11.5 g/dL — ABNORMAL LOW (ref 13.0–17.0)
MCH: 26.5 pg (ref 26.0–34.0)
MCHC: 34.7 g/dL (ref 30.0–36.0)
MCV: 76.3 fL — ABNORMAL LOW (ref 80.0–100.0)
Platelets: 199 10*3/uL (ref 150–400)
RBC: 4.34 MIL/uL (ref 4.22–5.81)
RDW: 13.7 % (ref 11.5–15.5)
WBC: 14.5 10*3/uL — ABNORMAL HIGH (ref 4.0–10.5)
nRBC: 0 % (ref 0.0–0.2)

## 2023-10-26 LAB — GLUCOSE, CAPILLARY
Glucose-Capillary: 154 mg/dL — ABNORMAL HIGH (ref 70–99)
Glucose-Capillary: 149 mg/dL — ABNORMAL HIGH (ref 70–99)
Glucose-Capillary: 147 mg/dL — ABNORMAL HIGH (ref 70–99)

## 2023-10-26 LAB — COMPREHENSIVE METABOLIC PANEL
ALT: 20 U/L (ref 0–44)
AST: 22 U/L (ref 15–41)
Albumin: 1.7 g/dL — ABNORMAL LOW (ref 3.5–5.0)
Alkaline Phosphatase: 66 U/L (ref 38–126)
Anion gap: 12 (ref 5–15)
BUN: 39 mg/dL — ABNORMAL HIGH (ref 6–20)
CO2: 24 mmol/L (ref 22–32)
Calcium: 7.3 mg/dL — ABNORMAL LOW (ref 8.9–10.3)
Chloride: 101 mmol/L (ref 98–111)
Creatinine, Ser: 2.68 mg/dL — ABNORMAL HIGH (ref 0.61–1.24)
GFR, Estimated: 28 mL/min — ABNORMAL LOW (ref >60)
Glucose, Bld: 149 mg/dL — ABNORMAL HIGH (ref 70–99)
Potassium: 3.9 mmol/L (ref 3.5–5.1)
Sodium: 137 mmol/L (ref 135–145)
Total Bilirubin: 1 mg/dL (ref 0.0–1.2)
Total Protein: 5.8 g/dL — ABNORMAL LOW (ref 6.5–8.1)

## 2023-10-26 LAB — MRSA NEXT GEN BY PCR, NASAL: MRSA by PCR Next Gen: NOT DETECTED

## 2023-10-26 LAB — I-STAT CG4 LACTIC ACID, ED: Lactic Acid, Venous: 1.1 mmol/L (ref 0.5–1.9)

## 2023-10-26 LAB — CBG MONITORING, ED
Glucose-Capillary: 140 mg/dL — ABNORMAL HIGH (ref 70–99)
Glucose-Capillary: 148 mg/dL — ABNORMAL HIGH (ref 70–99)

## 2023-10-26 LAB — PROCALCITONIN: Procalcitonin: 60.2 ng/mL

## 2023-10-26 LAB — MAGNESIUM: Magnesium: 2 mg/dL (ref 1.7–2.4)

## 2023-10-26 MED ORDER — SODIUM CHLORIDE 0.9 % IV SOLN: 500.0000 mg | INTRAVENOUS | Status: DC

## 2023-10-26 MED ORDER — AMLODIPINE BESYLATE 5 MG PO TABS
5.0000 mg | ORAL_TABLET | Freq: Every day | ORAL | Status: DC
  Administered 2023-10-26 – 2023-10-28 (×3): 5 mg via ORAL
  Filled 2023-10-26 (×3): qty 1

## 2023-10-26 MED ORDER — IPRATROPIUM-ALBUTEROL 0.5-2.5 (3) MG/3ML IN SOLN
3.0000 mL | Freq: Four times a day (QID) | RESPIRATORY_TRACT | Status: DC
  Administered 2023-10-26 (×2): 3 mL via RESPIRATORY_TRACT
  Filled 2023-10-26 (×3): qty 3

## 2023-10-26 MED ORDER — ROSUVASTATIN CALCIUM 20 MG PO TABS
20.0000 mg | ORAL_TABLET | Freq: Every day | ORAL | Status: DC
  Administered 2023-10-26 – 2023-10-29 (×4): 20 mg via ORAL
  Filled 2023-10-26 (×4): qty 1

## 2023-10-26 MED ORDER — AZITHROMYCIN 250 MG PO TABS
500.0000 mg | ORAL_TABLET | Freq: Every evening | ORAL | Status: DC
  Administered 2023-10-26 – 2023-10-28 (×3): 500 mg via ORAL
  Filled 2023-10-26 (×3): qty 2

## 2023-10-26 MED ORDER — ACETAMINOPHEN 325 MG PO TABS
650.0000 mg | ORAL_TABLET | Freq: Four times a day (QID) | ORAL | Status: DC | PRN
  Administered 2023-10-26 – 2023-10-29 (×2): 650 mg via ORAL
  Filled 2023-10-26 (×2): qty 2

## 2023-10-26 MED ORDER — SODIUM CHLORIDE 0.9 % IV SOLN
2.0000 g | INTRAVENOUS | Status: DC
  Administered 2023-10-26 – 2023-10-28 (×3): 2 g via INTRAVENOUS
  Filled 2023-10-26 (×3): qty 20

## 2023-10-26 MED ORDER — GUAIFENESIN-DM 100-10 MG/5ML PO SYRP: 10.0000 mL | ORAL_SOLUTION | ORAL | Status: DC | PRN

## 2023-10-26 MED ORDER — ALBUTEROL SULFATE (2.5 MG/3ML) 0.083% IN NEBU
2.5000 mg | INHALATION_SOLUTION | RESPIRATORY_TRACT | Status: DC | PRN
  Administered 2023-10-28: 2.5 mg via RESPIRATORY_TRACT
  Filled 2023-10-26: qty 3

## 2023-10-26 MED ORDER — IPRATROPIUM-ALBUTEROL 0.5-2.5 (3) MG/3ML IN SOLN
3.0000 mL | Freq: Two times a day (BID) | RESPIRATORY_TRACT | Status: DC
  Administered 2023-10-26: 3 mL via RESPIRATORY_TRACT
  Filled 2023-10-26: qty 3

## 2023-10-26 MED ORDER — GUAIFENESIN-DM 100-10 MG/5ML PO SYRP: 5.0000 mL | ORAL_SOLUTION | ORAL | Status: DC | PRN

## 2023-10-26 MED ORDER — IPRATROPIUM-ALBUTEROL 0.5-2.5 (3) MG/3ML IN SOLN: 3.0000 mL | Freq: Two times a day (BID) | RESPIRATORY_TRACT | Status: DC

## 2023-10-26 MED ORDER — SODIUM CHLORIDE 0.9 % IV SOLN: INTRAVENOUS | Status: AC

## 2023-10-26 MED ORDER — INSULIN ASPART 100 UNIT/ML IJ SOLN: 0.0000 [IU] | Freq: Every day | INTRAMUSCULAR | Status: DC

## 2023-10-26 MED ORDER — MELATONIN 5 MG PO TABS: 5.0000 mg | ORAL_TABLET | Freq: Every evening | ORAL | Status: DC | PRN

## 2023-10-26 MED ORDER — ENOXAPARIN SODIUM 40 MG/0.4ML IJ SOSY
40.0000 mg | PREFILLED_SYRINGE | Freq: Every day | INTRAMUSCULAR | Status: DC
Start: 2023-10-26 — End: 2023-10-29
  Administered 2023-10-26 – 2023-10-29 (×4): 40 mg via SUBCUTANEOUS
  Filled 2023-10-26 (×4): qty 0.4

## 2023-10-26 MED ORDER — INSULIN ASPART 100 UNIT/ML IJ SOLN
0.0000 [IU] | Freq: Three times a day (TID) | INTRAMUSCULAR | Status: DC
  Administered 2023-10-26: 1 [IU] via SUBCUTANEOUS
  Administered 2023-10-26: 2 [IU] via SUBCUTANEOUS
  Administered 2023-10-26 – 2023-10-27 (×3): 1 [IU] via SUBCUTANEOUS
  Administered 2023-10-27 – 2023-10-28 (×3): 2 [IU] via SUBCUTANEOUS
  Administered 2023-10-28: 3 [IU] via SUBCUTANEOUS
  Administered 2023-10-29: 2 [IU] via SUBCUTANEOUS
  Administered 2023-10-29: 1 [IU] via SUBCUTANEOUS

## 2023-10-26 NOTE — Progress Notes (Signed)
   10/26/23 1254  TOC Brief Assessment  Insurance and Status Reviewed  Patient has primary care physician Yes  Prior level of function: independent  Prior/Current Home Services No current home services  Social Drivers of Health Review SDOH reviewed no interventions necessary  Readmission risk has been reviewed No  Transition of care needs no transition of care needs at this time      Transition of Care Department [facility]) has reviewed patient and no TOC needs have been identified at this time. We will continue to monitor patient advancement through interdisciplinary progression rounds. If new patient transition needs arise, please place a TOC consult.

## 2023-10-26 NOTE — H&P (Signed)
History and Physical  Trevor Medina NWG:[id number] DOB: 1971/03/11 DOA: 10/25/2023  Referring physician: Dr. Suezanne Jacquet, EDP  PCP: Norm Salt, PA  Outpatient Specialists: None Patient coming from: Home  Chief Complaint: Flulike symptoms.  HPI: Trevor Medina is a 53 y.o. male with medical history significant for obesity, hypertension, hyperlipidemia, type 2 diabetes, recently seen in the ED and diagnosed with influenza A on 10/24/23, was out of window for Tamiflu without hypoxia or work of breathing, was discharged home with instruction to keep oral fluid intake and follow-up with PCP for recheck, who presents to the ER today for worsening flulike symptoms.  Not improved with over-the-counter medications.  Associated with subjective fevers, body aches, abdominal pain, and frequent stools 2 to 3/day for the past 48 hours.  In the ER, febrile with Tmax 100.8, tachycardic and tachypneic.  Lab studies notable for leukocytosis 16K.  Chest x-ray showing multifocal infiltrates suggestive of multifocal pneumonia.  Code sepsis was called in the ED.  The patient was started on empiric IV antibiotics Rocephin and IV azithromycin.  TRH, hospitalist service, was asked to admit.  ED Course: Temperature 100.8.  BP 146/91, pulse 108, respiration rate 23.  O2 saturation 92% on room air.  Review of Systems: Review of systems as noted in the HPI. All other systems reviewed and are negative.   Past Medical History:  Diagnosis Date   Chronic back pain    Diabetes mellitus without complication (HCC)    no meds - was given Rx for Metformin in March 2016 at urgent care ([facility]) but never filled Rx. Planned to treat with "diet and exercise"   History of gout    HNP (herniated nucleus pulposus), lumbar    Hypertension    Spinal stenosis    Past Surgical History:  Procedure Laterality Date   ANKLE SURGERY     LUMBAR LAMINECTOMY/DECOMPRESSION MICRODISCECTOMY Bilateral 07/07/2015   Procedure:  DECOMPRESSION L5-S1 BILATERALLY;  Surgeon: Jene Every, MD;  Location: [facility];  Service: Orthopedics;  Laterality: Bilateral;    Social History:  reports that he has never smoked. He has never used smokeless tobacco. He reports current alcohol use of about 2.0 standard drinks of alcohol per week. He reports that he does not use drugs.   No Known Allergies  Family History  Problem Relation Age of Onset   Cancer Mother 60       ovarian cancer   Diabetes Father       Prior to Admission medications   Medication Sig Start Date End Date Taking? Authorizing Provider  amLODipine-olmesartan (AZOR) 5-20 MG tablet Take 1 tablet by mouth daily.   Yes [provider]  metFORMIN (GLUCOPHAGE-XR) 500 MG 24 hr tablet Take 1 tablet (500 mg total) by mouth 2 (two) times daily after a meal. 11/16/18  Yes Nche, Bonna Gains, NP  Phenyleph-Doxylamine-DM-APAP (ALKA-SELTZER PLUS COLD & FLU) 10-12.5-20-650 MG PACK Take 1 Dose by mouth every 6 (six) hours as needed (cold and flu symptoms).   Yes [provider]  rosuvastatin (CRESTOR) 20 MG tablet Take 20 mg by mouth daily. 10/02/23  Yes [provider]    Physical Exam: BP (!) 150/88 (BP Location: Right Arm)   Pulse (!) 102   Temp 99.6 F (37.6 C) (Oral)   Resp (!) 28   Ht 5\' 10"  (1.778 m)   Wt 95.3 kg   SpO2 94%   BMI 30.13 kg/m   General: 53 y.o. year-old male well developed well nourished in no acute  distress.  Alert and oriented x3. Cardiovascular: Tachycardic with no rubs or gallops.  No thyromegaly or JVD noted.  No lower extremity edema. 2/4 pulses in all 4 extremities. Respiratory: Mild rales at bases.  Poor inspiratory effort. Abdomen: Soft nontender nondistended with normal bowel sounds x4 quadrants. Muskuloskeletal: No cyanosis, clubbing or edema noted bilaterally Neuro: CN II-XII intact, strength, sensation, reflexes Skin: No ulcerative lesions noted or rashes Psychiatry: Judgement and insight appear normal.  Mood is appropriate for condition and setting          Labs on Admission:  Basic Metabolic Panel: Recent Labs  Lab 10/25/23 1542  NA 135  K 3.8  CL 97*  CO2 27  GLUCOSE 148*  BUN 34*  CREATININE 2.76*  CALCIUM 7.6*   Liver Function Tests: Recent Labs  Lab 10/25/23 1542  AST 25  ALT 23  ALKPHOS 94  BILITOT 1.7*  PROT 6.5  ALBUMIN 2.0*   No results for input(s): "LIPASE", "AMYLASE" in the last 168 hours. No results for input(s): "AMMONIA" in the last 168 hours. CBC: Recent Labs  Lab 10/25/23 1542  WBC 16.0*  NEUTROABS 15.0*  HGB 12.6*  HCT 36.7*  MCV 76.6*  PLT 214   Cardiac Enzymes: No results for input(s): "CKTOTAL", "CKMB", "CKMBINDEX", "TROPONINI" in the last 168 hours.  BNP (last 3 results) No results for input(s): "BNP" in the last 8760 hours.  ProBNP (last 3 results) No results for input(s): "PROBNP" in the last 8760 hours.  CBG: No results for input(s): "GLUCAP" in the last 168 hours.  Radiological Exams on Admission: DG Chest 2 View Result Date: 10/25/2023 CLINICAL DATA:  Chest pain, flu like symptoms for 2-3 days EXAM: CHEST - 2 VIEW COMPARISON:  12/22/2014 FINDINGS: Frontal and lateral views of the chest demonstrate an unremarkable cardiac silhouette. There is patchy airspace disease in the perihilar regions bilaterally, with dense left lower lobe consolidation seen in the retrocardiac region. No effusion or pneumothorax. No acute bony abnormalities. IMPRESSION: 1. Multifocal pneumonia, most pronounced within the left lower lobe. Electronically Signed   By: Sharlet Salina M.D.   On: 10/25/2023 16:33    EKG: I independently viewed the EKG done and my findings are as followed: Sinus tachycardia rate of 118.  Nonspecific ST-T changes.  QTc 447.  Assessment/Plan Present on Admission:  Multifocal pneumonia  Principal Problem:   Multifocal pneumonia  Sepsis secondary to multifocal pneumonia in the setting of influenza A viral infection and  suspected superimposed bacterial pulmonary infection, POA Code sepsis called in the ED Diagnosed with influenza A on 10/24/2023, was out of window for Tamiflu Presented today with worsening flulike symptoms Multifocal infiltrates seen on chest x-ray Continue empiric IV antibiotics started in the ED, Rocephin and IV azithromycin Continue bronchodilators Pulmonary toilet As needed antitussives Obtain baseline procalcitonin Follow peripheral blood cultures x 2 Monitor fever curve and WBCs  AKI, prerenal in the setting of dehydration from poor oral intake, and frequent stools for the past 48 hours Presented with creatinine of 2.76 GFR 27 Start gentle IV fluid hydration Monitor urine output Avoid nephrotoxic agents, dehydration, and hypotension. Repeat BMP in the morning  Frequent stools likely from viral infection Endorses 2-3 watery stools per day for the past 48 hours. Continue supportive care Continue IV fluid hydration, encourage oral fluid intake  Isolated elevated bilirubin Nonspecific Monitor for now and avoid hepatotoxic agents.  Mildly elevated troponin, suspect demand ischemia in the setting of sepsis No evidence of acute ischemia on twelve-lead EKG  Monitor on telemetry.  Type 2 diabetes with hyperglycemia Last hemoglobin A1c 11.8 on 10/29/2018 Heart healthy, carb modified diet Insulin sliding scale  Hyperlipidemia Resume home Crestor  Hypertension Hold off home olmesartan due to AKI Resume home amlodipine Monitor vital signs.  Obesity BMI 30 Recommend weight loss outpatient with regular physical activity and healthy dieting.    Critical care time: 65 minutes.    DVT prophylaxis: Subcu Lovenox daily.  Code Status: Full code.  Family Communication: None at bedside.  Disposition Plan: Admitted to telemetry medical unit.  Consults called: None.  Admission status: Inpatient status.   Status is: Inpatient The patient requires at least 2 midnights  for further evaluation and treatment of present condition.   Darlin Drop MD [facility] Pager [phone]  If 7PM-7AM, please contact night-coverage [email or web address] Password [email or web address]  10/26/2023, 1:57 AM

## 2023-10-26 NOTE — Progress Notes (Addendum)
Mobility Specialist Progress Note:   10/26/23 1532  Mobility  Activity Ambulated with assistance to bathroom  Level of Assistance  (MinG)  Assistive Device None  Distance Ambulated (ft) 20 ft  Activity Response Tolerated well  Mobility Referral Yes  Mobility visit 1 Mobility  Mobility Specialist Start Time (ACUTE ONLY) 1515  Mobility Specialist Stop Time (ACUTE ONLY) 1530  Mobility Specialist Time Calculation (min) (ACUTE ONLY) 15 min    Pt received in bed, agreeable to mobility. No hands on assistance needed during ambulation. C/o mild dizziness upon standing but stated that it quickly resolved. Pt declining hallway ambulation, but agreeable to walk in room. Pt requesting to use BR during session. Void successful. Pt reaching out for objects in room for balance support but no unsteadiness present. Pt left in bed with call bell in reach and all needs met.  Leory Plowman  Mobility Specialist Please contact via [employer] office at [phone]

## 2023-10-26 NOTE — Progress Notes (Signed)
Patient admitted earlier this morning for flulike symptoms and was found to have multifocal pneumonia in the setting of influenza A with possible superimposed bacterial infection.  Patient seen and examined at bedside.  Plan of care discussed with him.  I have reviewed patient's medical records including this morning's H&P, current vitals, labs and medications myself.  Patient also has AKI and is on IV fluids.  Continue IV fluids, antibiotics and Tamiflu.  Follow procalcitonin.  Repeat a.m. labs.  Patient is still having a lot of diarrhea: Will check stool studies.

## 2023-10-27 DIAGNOSIS — J189 Pneumonia, unspecified organism: Secondary | ICD-10-CM | POA: Diagnosis not present

## 2023-10-27 LAB — GASTROINTESTINAL PANEL BY PCR, STOOL (REPLACES STOOL CULTURE)

## 2023-10-27 LAB — CBC WITH DIFFERENTIAL/PLATELET
Abs Immature Granulocytes: 0.26 10*3/uL — ABNORMAL HIGH (ref 0.00–0.07)
Basophils Absolute: 0 10*3/uL (ref 0.0–0.1)
Basophils Relative: 0 %
Eosinophils Absolute: 0.1 10*3/uL (ref 0.0–0.5)
Eosinophils Relative: 0 %
HCT: 31.7 % — ABNORMAL LOW (ref 39.0–52.0)
Hemoglobin: 10.8 g/dL — ABNORMAL LOW (ref 13.0–17.0)
Immature Granulocytes: 2 %
Lymphocytes Relative: 8 %
Lymphs Abs: 0.9 10*3/uL (ref 0.7–4.0)
MCH: 26.2 pg (ref 26.0–34.0)
MCHC: 34.1 g/dL (ref 30.0–36.0)
MCV: 76.8 fL — ABNORMAL LOW (ref 80.0–100.0)
Monocytes Absolute: 0.8 10*3/uL (ref 0.1–1.0)
Monocytes Relative: 7 %
Neutro Abs: 9.2 10*3/uL — ABNORMAL HIGH (ref 1.7–7.7)
Neutrophils Relative %: 83 %
Platelets: 299 10*3/uL (ref 150–400)
RBC: 4.13 MIL/uL — ABNORMAL LOW (ref 4.22–5.81)
RDW: 14 % (ref 11.5–15.5)
WBC: 11.2 10*3/uL — ABNORMAL HIGH (ref 4.0–10.5)
nRBC: 0 % (ref 0.0–0.2)

## 2023-10-27 LAB — COMPREHENSIVE METABOLIC PANEL
ALT: 21 U/L (ref 0–44)
AST: 22 U/L (ref 15–41)
Albumin: 1.5 g/dL — ABNORMAL LOW (ref 3.5–5.0)
Alkaline Phosphatase: 67 U/L (ref 38–126)
Anion gap: 10 (ref 5–15)
BUN: 35 mg/dL — ABNORMAL HIGH (ref 6–20)
CO2: 24 mmol/L (ref 22–32)
Calcium: 7.3 mg/dL — ABNORMAL LOW (ref 8.9–10.3)
Chloride: 105 mmol/L (ref 98–111)
Creatinine, Ser: 2.43 mg/dL — ABNORMAL HIGH (ref 0.61–1.24)
GFR, Estimated: 32 mL/min — ABNORMAL LOW (ref >60)
Glucose, Bld: 142 mg/dL — ABNORMAL HIGH (ref 70–99)
Potassium: 3.7 mmol/L (ref 3.5–5.1)
Sodium: 139 mmol/L (ref 135–145)
Total Bilirubin: 0.9 mg/dL (ref 0.0–1.2)
Total Protein: 5.5 g/dL — ABNORMAL LOW (ref 6.5–8.1)

## 2023-10-27 LAB — GLUCOSE, CAPILLARY
Glucose-Capillary: 147 mg/dL — ABNORMAL HIGH (ref 70–99)
Glucose-Capillary: 164 mg/dL — ABNORMAL HIGH (ref 70–99)
Glucose-Capillary: 164 mg/dL — ABNORMAL HIGH (ref 70–99)
Glucose-Capillary: 130 mg/dL — ABNORMAL HIGH (ref 70–99)
Glucose-Capillary: 186 mg/dL — ABNORMAL HIGH (ref 70–99)

## 2023-10-27 LAB — HEMOGLOBIN A1C
Hgb A1c MFr Bld: 11.2 % — ABNORMAL HIGH (ref 4.8–5.6)
Mean Plasma Glucose: 274.74 mg/dL

## 2023-10-27 LAB — C DIFFICILE QUICK SCREEN W PCR REFLEX
C Diff antigen: NEGATIVE
C Diff interpretation: NOT DETECTED
C Diff toxin: NEGATIVE

## 2023-10-27 LAB — PROCALCITONIN: Procalcitonin: 27.26 ng/mL

## 2023-10-27 LAB — HIV ANTIBODY (ROUTINE TESTING W REFLEX): HIV Screen 4th Generation wRfx: NONREACTIVE

## 2023-10-27 LAB — MAGNESIUM: Magnesium: 2.5 mg/dL — ABNORMAL HIGH (ref 1.7–2.4)

## 2023-10-27 MED ORDER — HYDRALAZINE HCL 25 MG PO TABS: 25.0000 mg | ORAL_TABLET | Freq: Four times a day (QID) | ORAL | Status: DC | PRN

## 2023-10-27 MED ORDER — SODIUM CHLORIDE 0.9 % IV SOLN: INTRAVENOUS | Status: DC

## 2023-10-27 NOTE — Plan of Care (Signed)
Problem: Education: Goal: Ability to describe self-care measures that may prevent or decrease complications (Diabetes Survival Skills Education) will improve Outcome: Progressing Goal: Individualized Educational Video(s) Outcome: Progressing

## 2023-10-27 NOTE — Plan of Care (Signed)

## 2023-10-27 NOTE — Progress Notes (Signed)
PROGRESS NOTE    Trevor Medina  GNF:[id number] DOB: [birth year] DOA: 10/25/2023 PCP: Norm Salt, PA   Brief Narrative:  53 y.o. male with medical history significant for obesity, hypertension, hyperlipidemia, type 2 diabetes, recently seen in the ED and diagnosed with influenza A on 10/24/23, was out of window for Tamiflu without hypoxia or work of breathing, was discharged home with instruction to keep oral fluid intake and follow-up with PCP for recheck presented with worsening flulike symptoms along with fevers and diarrhea, poor oral intake.  On presentation, he was febrile, tachycardic and tachypneic with leukocytosis and chest x-ray showing multifocal infiltrates suggestive of multifocal pneumonia.  He also had AKI.  He was started on IV antibiotics, Rocephin, Zithromax.  Assessment & Plan:   Sepsis: Present on admission Multifocal pneumonia in the setting of influenza A viral infection and suspected superimposed bacterial pulmonary infection -Hemodynamically improving.  Continue IV fluids.  Out of the window for Tamiflu. -Remains on room air.  Continue Rocephin and Zithromax.  Procalcitonin improving to 27.26 this morning.  Patient feels slightly better.  No temperature spikes over the last 24 hours  AKI -Prerenal due to diarrhea, poor oral intake.  Creatinine 2.76 on presentation.  Improving to 2.43 today.  Continue IV fluids.  Repeat a.m. labs  Leukocytosis Improving  Diarrhea -Most likely viral diarrhea.  Stool for C. difficile negative.  Stool for GI PCR pending.  Diarrhea is improving.  Hypertension -Continue amlodipine.  Olmesartan on hold.  Blood pressure intermittently on the high side  Hyperlipidemia Continue Crestor  Diabetes mellitus type 2 with hyperglycemia -A1c 11.2.  Continue CBGs with SSI. Carb modified diet.  Outpatient follow-up with PCP regarding need for insulin  Microcytic anemia -Questionable cause.  Hemoglobin stable.  Outpatient  follow-up  Obesity class I -Outpatient follow-up  Isolated elevation of bilirubin Resolved    DVT prophylaxis: Subcutaneous Lovenox Code Status: Full Family Communication: None at bedside Disposition Plan: Status is: Inpatient Remains inpatient appropriate because: Of severity of illness    Consultants: None  Procedures: None  Antimicrobials: Rocephin and Zithromax from 10/25/2023 onwards   Subjective: Patient seen and examined at bedside.  He is feeling slightly better.  Still coughing but improving.  Diarrhea is improving.  Oral intake is also improving.  Denies worsening abdominal pain or vomiting.  Objective: Vitals:   10/27/23 0038 10/27/23 0457 10/27/23 0458 10/27/23 0847  BP: (!) 141/87 (!) 145/83  (!) 159/91  Pulse: 89 96  81  Resp: 18 18  17   Temp: 98 F (36.7 C) 99.1 F (37.3 C)  99 F (37.2 C)  TempSrc: Oral Oral  Oral  SpO2: 97% 100%  98%  Weight:   95 kg   Height:        Intake/Output Summary (Last 24 hours) at 10/27/2023 1059 Last data filed at 10/27/2023 0900 Gross per 24 hour  Intake 1910.42 ml  Output --  Net 1910.42 ml   Filed Weights   10/25/23 1534 10/27/23 0458  Weight: 95.3 kg 95 kg    Examination:  General exam: Appears calm and comfortable.  On room air. Respiratory system: Bilateral decreased breath sounds at bases with scattered crackles Cardiovascular system: S1 & S2 heard, Rate controlled Gastrointestinal system: Abdomen is obese, nondistended, soft and nontender. Normal bowel sounds heard. Extremities: No cyanosis, clubbing, edema  Central nervous system: Alert and oriented. No focal neurological deficits. Moving extremities Skin: No rashes, lesions or ulcers Psychiatry: Judgement and insight appear normal. Mood & affect appropriate.  Data Reviewed: I have personally reviewed following labs and imaging studies  CBC: Recent Labs  Lab 10/25/23 1542 10/26/23 0527 10/27/23 0722  WBC 16.0* 14.5* 11.2*  NEUTROABS  15.0*  --  9.2*  HGB 12.6* 11.5* 10.8*  HCT 36.7* 33.1* 31.7*  MCV 76.6* 76.3* 76.8*  PLT 214 199 299   Basic Metabolic Panel: Recent Labs  Lab 10/25/23 1542 10/26/23 0525 10/27/23 0722  NA 135 137 139  K 3.8 3.9 3.7  CL 97* 101 105  CO2 27 24 24   GLUCOSE 148* 149* 142*  BUN 34* 39* 35*  CREATININE 2.76* 2.68* 2.43*  CALCIUM 7.6* 7.3* 7.3*  MG  --  2.0 2.5*  PHOS  --  3.4  --    GFR: Estimated Creatinine Clearance: 42.1 mL/min (A) (by C-G formula based on SCr of 2.43 mg/dL (H)). Liver Function Tests: Recent Labs  Lab 10/25/23 1542 10/26/23 0525 10/27/23 0722  AST 25 22 22   ALT 23 20 21   ALKPHOS 94 66 67  BILITOT 1.7* 1.0 0.9  PROT 6.5 5.8* 5.5*  ALBUMIN 2.0* 1.7* 1.5*   No results for input(s): "LIPASE", "AMYLASE" in the last 168 hours. No results for input(s): "AMMONIA" in the last 168 hours. Coagulation Profile: No results for input(s): "INR", "PROTIME" in the last 168 hours. Cardiac Enzymes: No results for input(s): "CKTOTAL", "CKMB", "CKMBINDEX", "TROPONINI" in the last 168 hours. BNP (last 3 results) No results for input(s): "PROBNP" in the last 8760 hours. HbA1C: Recent Labs    10/27/23 0722  HGBA1C 11.2*   CBG: Recent Labs  Lab 10/26/23 1307 10/26/23 1658 10/26/23 2058 10/27/23 0827 10/27/23 0845  GLUCAP 154* 149* 147* 147* 164*   Lipid Profile: No results for input(s): "CHOL", "HDL", "LDLCALC", "TRIG", "CHOLHDL", "LDLDIRECT" in the last 72 hours. Thyroid Function Tests: No results for input(s): "TSH", "T4TOTAL", "FREET4", "T3FREE", "THYROIDAB" in the last 72 hours. Anemia Panel: No results for input(s): "VITAMINB12", "FOLATE", "FERRITIN", "TIBC", "IRON", "RETICCTPCT" in the last 72 hours. Sepsis Labs: Recent Labs  Lab 10/25/23 2133 10/26/23 0525 10/26/23 0611 10/27/23 0722  PROCALCITON  --  60.20  --  27.26  LATICACIDVEN 1.5  --  1.1  --     Recent Results (from the past 240 hours)  Resp panel by RT-PCR (RSV, Flu A&B, Covid)  Anterior Nasal Swab     Status: Abnormal   Collection Time: 10/24/23  8:40 PM   Specimen: Anterior Nasal Swab  Result Value Ref Range Status   SARS Coronavirus 2 by RT PCR NEGATIVE NEGATIVE Final   Influenza A by PCR POSITIVE (A) NEGATIVE Final   Influenza B by PCR NEGATIVE NEGATIVE Final    Comment: (NOTE) The Xpert Xpress SARS-CoV-2/FLU/RSV plus assay is intended as an aid in the diagnosis of influenza from Nasopharyngeal swab specimens and should not be used as a sole basis for treatment. Nasal washings and aspirates are unacceptable for Xpert Xpress SARS-CoV-2/FLU/RSV testing.  Fact Sheet for Patients: BloggerCourse.com  Fact Sheet for Healthcare Providers: SeriousBroker.it  This test is not yet approved or cleared by the [place] FDA and has been authorized for detection and/or diagnosis of SARS-CoV-2 by FDA under an Emergency Use Authorization (EUA). This EUA will remain in effect (meaning this test can be used) for the duration of the COVID-19 declaration under Section 564(b)(1) of the Act, 21 U.S.C. section 360bbb-3(b)(1), unless the authorization is terminated or revoked.     Resp Syncytial Virus by PCR NEGATIVE NEGATIVE Final    Comment: (NOTE)  Fact Sheet for Patients: BloggerCourse.com  Fact Sheet for Healthcare Providers: SeriousBroker.it  This test is not yet approved or cleared by the [place] FDA and has been authorized for detection and/or diagnosis of SARS-CoV-2 by FDA under an Emergency Use Authorization (EUA). This EUA will remain in effect (meaning this test can be used) for the duration of the COVID-19 declaration under Section 564(b)(1) of the Act, 21 U.S.C. section 360bbb-3(b)(1), unless the authorization is terminated or revoked.  Performed at [facility], [address] [place], [place] [address]   Culture, blood (routine x 2)      Status: None (Preliminary result)   Collection Time: 10/25/23  9:08 PM   Specimen: BLOOD  Result Value Ref Range Status   Specimen Description BLOOD RIGHT ANTECUBITAL  Final   Special Requests   Final    BOTTLES DRAWN AEROBIC AND ANAEROBIC Blood Culture adequate volume   Culture   Final    NO GROWTH 2 DAYS Performed at [facility], [address] [place], [place] [address]    Report Status PENDING  Incomplete  Culture, blood (routine x 2)     Status: None (Preliminary result)   Collection Time: 10/25/23  9:13 PM   Specimen: BLOOD  Result Value Ref Range Status   Specimen Description BLOOD BLOOD RIGHT FOREARM  Final   Special Requests   Final    BOTTLES DRAWN AEROBIC AND ANAEROBIC Blood Culture adequate volume   Culture   Final    NO GROWTH 2 DAYS Performed at [facility], [address] [place], [place] [address]    Report Status PENDING  Incomplete  MRSA Next Gen by PCR, Nasal     Status: None   Collection Time: 10/26/23  5:25 AM   Specimen: Nasal Mucosa; Nasal Swab  Result Value Ref Range Status   MRSA by PCR Next Gen NOT DETECTED NOT DETECTED Final    Comment: (NOTE) The GeneXpert MRSA Assay (FDA approved for NASAL specimens only), is one component of a comprehensive MRSA colonization surveillance program. It is not intended to diagnose MRSA infection nor to guide or monitor treatment for MRSA infections. Test performance is not FDA approved in patients less than 75 years old. Performed at [facility], [address] [place], [place] [address]   C Difficile Quick Screen w PCR reflex     Status: None   Collection Time: 10/26/23 10:43 AM   Specimen: STOOL  Result Value Ref Range Status   C Diff antigen NEGATIVE NEGATIVE Final   C Diff toxin NEGATIVE NEGATIVE Final   C Diff interpretation No C. difficile detected.  Final    Comment: Performed at [facility], [address] [place], [place] [address]  Culture, blood (single)     Status:  None (Preliminary result)   Collection Time: 10/26/23  1:22 PM   Specimen: BLOOD  Result Value Ref Range Status   Specimen Description BLOOD SITE NOT SPECIFIED  Final   Special Requests   Final    BOTTLES DRAWN AEROBIC AND ANAEROBIC Blood Culture adequate volume   Culture   Final    NO GROWTH < 24 HOURS Performed at [facility], [address] [place], [place] [address]    Report Status PENDING  Incomplete         Radiology Studies: DG Chest 2 View Result Date: 10/25/2023 CLINICAL DATA:  Chest pain, flu like symptoms for 2-3 days EXAM: CHEST -  2 VIEW COMPARISON:  12/22/2014 FINDINGS: Frontal and lateral views of the chest demonstrate an unremarkable cardiac silhouette. There is patchy airspace disease in the perihilar regions bilaterally, with dense left lower lobe consolidation seen in the retrocardiac region. No effusion or pneumothorax. No acute bony abnormalities. IMPRESSION: 1. Multifocal pneumonia, most pronounced within the left lower lobe. Electronically Signed   By: Sharlet Salina M.D.   On: 10/25/2023 16:33        Scheduled Meds:  amLODipine  5 mg Oral Daily   azithromycin  500 mg Oral QPM   enoxaparin (LOVENOX) injection  40 mg Subcutaneous Daily   insulin aspart  0-5 Units Subcutaneous QHS   insulin aspart  0-9 Units Subcutaneous TID WC   rosuvastatin  20 mg Oral Daily   Continuous Infusions:  cefTRIAXone (ROCEPHIN)  IV 2 g (10/26/23 2225)          Glade Lloyd, MD [facility] 10/27/2023, 10:59 AM

## 2023-10-27 NOTE — Progress Notes (Signed)
Mobility Specialist Progress Note:    10/27/23 1200  Mobility  Activity Ambulated independently in hallway;Ambulated with assistance in hallway  Level of Assistance Standby assist, set-up cues, supervision of patient - no hands on  Assistive Device None;Other (Comment) (Handrails)  Distance Ambulated (ft) 150 ft  Activity Response Tolerated well  Mobility Referral Yes  Mobility visit 1 Mobility  Mobility Specialist Start Time (ACUTE ONLY) 1018  Mobility Specialist Stop Time (ACUTE ONLY) 1034  Mobility Specialist Time Calculation (min) (ACUTE ONLY) 16 min   Pt received in bed and agreeable. No complaints throughout. Denied any dizziness or SOB. Pt left in bed with call bell and all needs met.  D'Vante Earlene Plater Mobility Specialist Please contact via Special educational needs teacher or Rehab office at [phone]

## 2023-10-28 DIAGNOSIS — J189 Pneumonia, unspecified organism: Secondary | ICD-10-CM | POA: Diagnosis not present

## 2023-10-28 LAB — CBC WITH DIFFERENTIAL/PLATELET
Abs Immature Granulocytes: 0.31 10*3/uL — ABNORMAL HIGH (ref 0.00–0.07)
Basophils Absolute: 0 10*3/uL (ref 0.0–0.1)
Basophils Relative: 0 %
Eosinophils Absolute: 0.2 10*3/uL (ref 0.0–0.5)
Eosinophils Relative: 2 %
HCT: 32.5 % — ABNORMAL LOW (ref 39.0–52.0)
Hemoglobin: 10.9 g/dL — ABNORMAL LOW (ref 13.0–17.0)
Immature Granulocytes: 3 %
Lymphocytes Relative: 10 %
Lymphs Abs: 1 10*3/uL (ref 0.7–4.0)
MCH: 25.9 pg — ABNORMAL LOW (ref 26.0–34.0)
MCHC: 33.5 g/dL (ref 30.0–36.0)
MCV: 77.2 fL — ABNORMAL LOW (ref 80.0–100.0)
Monocytes Absolute: 1.2 10*3/uL — ABNORMAL HIGH (ref 0.1–1.0)
Monocytes Relative: 11 %
Neutro Abs: 7.8 10*3/uL — ABNORMAL HIGH (ref 1.7–7.7)
Neutrophils Relative %: 74 %
Platelets: 379 10*3/uL (ref 150–400)
RBC: 4.21 MIL/uL — ABNORMAL LOW (ref 4.22–5.81)
RDW: 14 % (ref 11.5–15.5)
WBC: 10.6 10*3/uL — ABNORMAL HIGH (ref 4.0–10.5)
nRBC: 0 % (ref 0.0–0.2)

## 2023-10-28 LAB — GLUCOSE, CAPILLARY
Glucose-Capillary: 196 mg/dL — ABNORMAL HIGH (ref 70–99)
Glucose-Capillary: 194 mg/dL — ABNORMAL HIGH (ref 70–99)
Glucose-Capillary: 212 mg/dL — ABNORMAL HIGH (ref 70–99)
Glucose-Capillary: 157 mg/dL — ABNORMAL HIGH (ref 70–99)

## 2023-10-28 LAB — BASIC METABOLIC PANEL
Anion gap: 8 (ref 5–15)
BUN: 25 mg/dL — ABNORMAL HIGH (ref 6–20)
CO2: 24 mmol/L (ref 22–32)
Calcium: 7.7 mg/dL — ABNORMAL LOW (ref 8.9–10.3)
Chloride: 105 mmol/L (ref 98–111)
Creatinine, Ser: 1.81 mg/dL — ABNORMAL HIGH (ref 0.61–1.24)
GFR, Estimated: 45 mL/min — ABNORMAL LOW (ref >60)
Glucose, Bld: 217 mg/dL — ABNORMAL HIGH (ref 70–99)
Potassium: 3.9 mmol/L (ref 3.5–5.1)
Sodium: 137 mmol/L (ref 135–145)

## 2023-10-28 LAB — PROCALCITONIN: Procalcitonin: 8.15 ng/mL

## 2023-10-28 LAB — MAGNESIUM: Magnesium: 2.3 mg/dL (ref 1.7–2.4)

## 2023-10-28 NOTE — Progress Notes (Signed)
PROGRESS NOTE    Trevor Medina  UJW:[id number] DOB: December 04, 1970 DOA: 10/25/2023 PCP: Norm Salt, PA   Brief Narrative:  53 y.o. male with medical history significant for obesity, hypertension, hyperlipidemia, type 2 diabetes, recently seen in the ED and diagnosed with influenza A on 10/24/23, was out of window for Tamiflu without hypoxia or work of breathing, was discharged home with instruction to keep oral fluid intake and follow-up with PCP for recheck presented with worsening flulike symptoms along with fevers and diarrhea, poor oral intake.  On presentation, he was febrile, tachycardic and tachypneic with leukocytosis and chest x-ray showing multifocal infiltrates suggestive of multifocal pneumonia.  He also had AKI.  He was started on IV antibiotics, Rocephin, Zithromax.  Assessment & Plan:   Sepsis: Present on admission Multifocal pneumonia in the setting of influenza A viral infection and suspected superimposed bacterial pulmonary infection -Hemodynamically improving.  Currently on IV fluids.  Out of the window for Tamiflu. -Remains on room air.  Continue Rocephin and Zithromax.  No temperature spikes over the last 48 hours.  AKI -Prerenal due to diarrhea, poor oral intake.  Creatinine 2.76 on presentation.  Improving to 2.43 on 10/27/2023.  Labs pending for today.  Continue IV fluids.  Repeat a.m. labs  Leukocytosis -Improving.  Labs pending for today.  Diarrhea -Most likely viral diarrhea.  Stool for C. difficile negative.  Stool for GI PCR negative.  Diarrhea is improving.  Hypertension -Continue amlodipine.  Olmesartan on hold.  Blood pressure intermittently on the high side  Hyperlipidemia Continue Crestor  Diabetes mellitus type 2 with hyperglycemia -A1c 11.2.  Continue CBGs with SSI. Carb modified diet.  Outpatient follow-up with PCP regarding need for insulin  Microcytic anemia -Questionable cause.  Hemoglobin stable.  Outpatient follow-up  Obesity class  I -Outpatient follow-up  Isolated elevation of bilirubin -Resolved    DVT prophylaxis: Subcutaneous Lovenox Code Status: Full Family Communication: None at bedside Disposition Plan: Status is: Inpatient Remains inpatient appropriate because: Of severity of illness.  Possible discharge in 1 to 2 days if clinically improves    Consultants: None  Procedures: None  Antimicrobials: Rocephin and Zithromax from 10/25/2023 onwards   Subjective: Patient seen and examined at bedside.  Continues to feel better with improving appetite and diarrhea.  Still has intermittent shortness of breath and cough but improving.  No fever or chest pain reported. Objective: Vitals:   10/27/23 1600 10/27/23 2121 10/28/23 0446 10/28/23 0832  BP: (!) 164/89 (!) 160/92 (!) 167/89 (!) 170/84  Pulse:  90 84 95  Resp:  17 17 18   Temp: 98.6 F (37 C) 98.8 F (37.1 C) 98.1 F (36.7 C) 98 F (36.7 C)  TempSrc:  Oral Oral   SpO2:  96% 97%   Weight:      Height:        Intake/Output Summary (Last 24 hours) at 10/28/2023 0837 Last data filed at 10/28/2023 0553 Gross per 24 hour  Intake 2224.77 ml  Output --  Net 2224.77 ml   Filed Weights   10/25/23 1534 10/27/23 0458  Weight: 95.3 kg 95 kg    Examination:  General: Currently on room air.  No distress ENT/neck: No thyromegaly.  JVD is not elevated  respiratory: Decreased breath sounds at bases bilaterally with some crackles; no wheezing  CVS: S1-S2 heard, rate controlled currently Abdominal: Soft, nontender, slightly distended; no organomegaly, bowel sounds are heard Extremities: Trace lower extremity edema; no cyanosis  CNS: Awake and alert.  No focal neurologic  deficit.  Moves extremities Lymph: No obvious lymphadenopathy Skin: No obvious ecchymosis/lesions  psych: Affect, judgment and mood are normal  musculoskeletal: No obvious joint swelling/deformity     Data Reviewed: I have personally reviewed following labs and imaging  studies  CBC: Recent Labs  Lab 10/25/23 1542 10/26/23 0527 10/27/23 0722  WBC 16.0* 14.5* 11.2*  NEUTROABS 15.0*  --  9.2*  HGB 12.6* 11.5* 10.8*  HCT 36.7* 33.1* 31.7*  MCV 76.6* 76.3* 76.8*  PLT 214 199 299   Basic Metabolic Panel: Recent Labs  Lab 10/25/23 1542 10/26/23 0525 10/27/23 0722  NA 135 137 139  K 3.8 3.9 3.7  CL 97* 101 105  CO2 27 24 24   GLUCOSE 148* 149* 142*  BUN 34* 39* 35*  CREATININE 2.76* 2.68* 2.43*  CALCIUM 7.6* 7.3* 7.3*  MG  --  2.0 2.5*  PHOS  --  3.4  --    GFR: Estimated Creatinine Clearance: 42.1 mL/min (A) (by C-G formula based on SCr of 2.43 mg/dL (H)). Liver Function Tests: Recent Labs  Lab 10/25/23 1542 10/26/23 0525 10/27/23 0722  AST 25 22 22   ALT 23 20 21   ALKPHOS 94 66 67  BILITOT 1.7* 1.0 0.9  PROT 6.5 5.8* 5.5*  ALBUMIN 2.0* 1.7* 1.5*   No results for input(s): "LIPASE", "AMYLASE" in the last 168 hours. No results for input(s): "AMMONIA" in the last 168 hours. Coagulation Profile: No results for input(s): "INR", "PROTIME" in the last 168 hours. Cardiac Enzymes: No results for input(s): "CKTOTAL", "CKMB", "CKMBINDEX", "TROPONINI" in the last 168 hours. BNP (last 3 results) No results for input(s): "PROBNP" in the last 8760 hours. HbA1C: Recent Labs    10/27/23 0722  HGBA1C 11.2*   CBG: Recent Labs  Lab 10/27/23 0845 10/27/23 1208 10/27/23 1606 10/27/23 2119 10/28/23 0830  GLUCAP 164* 164* 130* 186* 196*   Lipid Profile: No results for input(s): "CHOL", "HDL", "LDLCALC", "TRIG", "CHOLHDL", "LDLDIRECT" in the last 72 hours. Thyroid Function Tests: No results for input(s): "TSH", "T4TOTAL", "FREET4", "T3FREE", "THYROIDAB" in the last 72 hours. Anemia Panel: No results for input(s): "VITAMINB12", "FOLATE", "FERRITIN", "TIBC", "IRON", "RETICCTPCT" in the last 72 hours. Sepsis Labs: Recent Labs  Lab 10/25/23 2133 10/26/23 0525 10/26/23 0611 10/27/23 0722  PROCALCITON  --  60.20  --  27.26   LATICACIDVEN 1.5  --  1.1  --     Recent Results (from the past 240 hours)  Resp panel by RT-PCR (RSV, Flu A&B, Covid) Anterior Nasal Swab     Status: Abnormal   Collection Time: 10/24/23  8:40 PM   Specimen: Anterior Nasal Swab  Result Value Ref Range Status   SARS Coronavirus 2 by RT PCR NEGATIVE NEGATIVE Final   Influenza A by PCR POSITIVE (A) NEGATIVE Final   Influenza B by PCR NEGATIVE NEGATIVE Final    Comment: (NOTE) The Xpert Xpress SARS-CoV-2/FLU/RSV plus assay is intended as an aid in the diagnosis of influenza from Nasopharyngeal swab specimens and should not be used as a sole basis for treatment. Nasal washings and aspirates are unacceptable for Xpert Xpress SARS-CoV-2/FLU/RSV testing.  Fact Sheet for Patients: BloggerCourse.com  Fact Sheet for Healthcare Providers: SeriousBroker.it  This test is not yet approved or cleared by the [place] FDA and has been authorized for detection and/or diagnosis of SARS-CoV-2 by FDA under an Emergency Use Authorization (EUA). This EUA will remain in effect (meaning this test can be used) for the duration of the COVID-19 declaration under Section 564(b)(1) of  the Act, 21 U.S.C. section 360bbb-3(b)(1), unless the authorization is terminated or revoked.     Resp Syncytial Virus by PCR NEGATIVE NEGATIVE Final    Comment: (NOTE) Fact Sheet for Patients: BloggerCourse.com  Fact Sheet for Healthcare Providers: SeriousBroker.it  This test is not yet approved or cleared by the [place] FDA and has been authorized for detection and/or diagnosis of SARS-CoV-2 by FDA under an Emergency Use Authorization (EUA). This EUA will remain in effect (meaning this test can be used) for the duration of the COVID-19 declaration under Section 564(b)(1) of the Act, 21 U.S.C. section 360bbb-3(b)(1), unless the authorization is terminated  or revoked.  Performed at [facility], [address] [place], [place] [address]   Culture, blood (routine x 2)     Status: None (Preliminary result)   Collection Time: 10/25/23  9:08 PM   Specimen: BLOOD  Result Value Ref Range Status   Specimen Description BLOOD RIGHT ANTECUBITAL  Final   Special Requests   Final    BOTTLES DRAWN AEROBIC AND ANAEROBIC Blood Culture adequate volume   Culture   Final    NO GROWTH 2 DAYS Performed at [facility], [address], [place], [place] [address]    Report Status PENDING  Incomplete  Culture, blood (routine x 2)     Status: None (Preliminary result)   Collection Time: 10/25/23  9:13 PM   Specimen: BLOOD  Result Value Ref Range Status   Specimen Description BLOOD BLOOD RIGHT FOREARM  Final   Special Requests   Final    BOTTLES DRAWN AEROBIC AND ANAEROBIC Blood Culture adequate volume   Culture   Final    NO GROWTH 2 DAYS Performed at [facility], [address] [place], [place] [address]    Report Status PENDING  Incomplete  MRSA Next Gen by PCR, Nasal     Status: None   Collection Time: 10/26/23  5:25 AM   Specimen: Nasal Mucosa; Nasal Swab  Result Value Ref Range Status   MRSA by PCR Next Gen NOT DETECTED NOT DETECTED Final    Comment: (NOTE) The GeneXpert MRSA Assay (FDA approved for NASAL specimens only), is one component of a comprehensive MRSA colonization surveillance program. It is not intended to diagnose MRSA infection nor to guide or monitor treatment for MRSA infections. Test performance is not FDA approved in patients less than 27 years old. Performed at [facility], [address], [place], [place] [address]   Gastrointestinal Panel by PCR , Stool     Status: None   Collection Time: 10/26/23 10:43 AM   Specimen: STOOL  Result Value Ref Range Status   Campylobacter species NOT DETECTED NOT DETECTED Final   Plesimonas shigelloides NOT DETECTED NOT DETECTED Final   Salmonella species  NOT DETECTED NOT DETECTED Final   Yersinia enterocolitica NOT DETECTED NOT DETECTED Final   Vibrio species NOT DETECTED NOT DETECTED Final   Vibrio cholerae NOT DETECTED NOT DETECTED Final   Enteroaggregative E coli (EAEC) NOT DETECTED NOT DETECTED Final   Enteropathogenic E coli (EPEC) NOT DETECTED NOT DETECTED Final   Enterotoxigenic E coli (ETEC) NOT DETECTED NOT DETECTED Final   Shiga like toxin producing E coli (STEC) NOT DETECTED NOT DETECTED Final   Shigella/Enteroinvasive E coli (EIEC) NOT DETECTED NOT DETECTED Final   Cryptosporidium NOT DETECTED NOT DETECTED Final   Cyclospora cayetanensis NOT DETECTED NOT DETECTED Final   Entamoeba histolytica NOT DETECTED NOT DETECTED Final  Giardia lamblia NOT DETECTED NOT DETECTED Final   Adenovirus F40/41 NOT DETECTED NOT DETECTED Final   Astrovirus NOT DETECTED NOT DETECTED Final   Norovirus GI/GII NOT DETECTED NOT DETECTED Final   Rotavirus A NOT DETECTED NOT DETECTED Final   Sapovirus (I, II, IV, and V) NOT DETECTED NOT DETECTED Final    Comment: Performed at [facility], [address], [place], [place] [address]  C Difficile Quick Screen w PCR reflex     Status: None   Collection Time: 10/26/23 10:43 AM   Specimen: STOOL  Result Value Ref Range Status   C Diff antigen NEGATIVE NEGATIVE Final   C Diff toxin NEGATIVE NEGATIVE Final   C Diff interpretation No C. difficile detected.  Final    Comment: Performed at [facility], [address] [place], [place] [address]  Culture, blood (single)     Status: None (Preliminary result)   Collection Time: 10/26/23  1:22 PM   Specimen: BLOOD  Result Value Ref Range Status   Specimen Description BLOOD SITE NOT SPECIFIED  Final   Special Requests   Final    BOTTLES DRAWN AEROBIC AND ANAEROBIC Blood Culture adequate volume   Culture   Final    NO GROWTH < 24 HOURS Performed at [facility], [address] [place], [place] [address]    Report Status PENDING   Incomplete         Radiology Studies: No results found.       Scheduled Meds:  amLODipine  5 mg Oral Daily   azithromycin  500 mg Oral QPM   enoxaparin (LOVENOX) injection  40 mg Subcutaneous Daily   insulin aspart  0-5 Units Subcutaneous QHS   insulin aspart  0-9 Units Subcutaneous TID WC   rosuvastatin  20 mg Oral Daily   Continuous Infusions:  sodium chloride 100 mL/hr at 10/27/23 1848   cefTRIAXone (ROCEPHIN)  IV 2 g (10/27/23 2125)          Glade Lloyd, MD [facility] 10/28/2023, 8:37 AM

## 2023-10-28 NOTE — Progress Notes (Signed)
Mobility Specialist Progress Note:    10/28/23 1100  Mobility  Activity Ambulated independently in hallway  Level of Assistance Standby assist, set-up cues, supervision of patient - no hands on  Assistive Device None  Distance Ambulated (ft) 300 ft  Activity Response Tolerated well  Mobility Referral Yes  Mobility visit 1 Mobility  Mobility Specialist Start Time (ACUTE ONLY) 1012  Mobility Specialist Stop Time (ACUTE ONLY) 1018  Mobility Specialist Time Calculation (min) (ACUTE ONLY) 6 min   Received pt in bed having no complaints and agreeable to mobility. Pt was asymptomatic throughout ambulation and returned to room w/o fault. Left in bed w/ call bell in reach and all needs met.   D'Vante Earlene Plater Mobility Specialist Please contact via Special educational needs teacher or Rehab office at [phone]

## 2023-10-28 NOTE — Plan of Care (Signed)
Problem: Education: Goal: Ability to describe self-care measures that may prevent or decrease complications (Diabetes Survival Skills Education) will improve Outcome: Progressing Goal: Individualized Educational Video(s) Outcome: Progressing

## 2023-10-29 DIAGNOSIS — J189 Pneumonia, unspecified organism: Secondary | ICD-10-CM | POA: Diagnosis not present

## 2023-10-29 LAB — RENAL FUNCTION PANEL
Albumin: 1.6 g/dL — ABNORMAL LOW (ref 3.5–5.0)
Anion gap: 10 (ref 5–15)
BUN: 17 mg/dL (ref 6–20)
CO2: 22 mmol/L (ref 22–32)
Calcium: 7.5 mg/dL — ABNORMAL LOW (ref 8.9–10.3)
Chloride: 103 mmol/L (ref 98–111)
Creatinine, Ser: 1.65 mg/dL — ABNORMAL HIGH (ref 0.61–1.24)
GFR, Estimated: 50 mL/min — ABNORMAL LOW (ref >60)
Glucose, Bld: 159 mg/dL — ABNORMAL HIGH (ref 70–99)
Phosphorus: 3.4 mg/dL (ref 2.5–4.6)
Potassium: 3.5 mmol/L (ref 3.5–5.1)
Sodium: 135 mmol/L (ref 135–145)

## 2023-10-29 LAB — GLUCOSE, CAPILLARY
Glucose-Capillary: 149 mg/dL — ABNORMAL HIGH (ref 70–99)
Glucose-Capillary: 191 mg/dL — ABNORMAL HIGH (ref 70–99)

## 2023-10-29 LAB — MAGNESIUM: Magnesium: 2.1 mg/dL (ref 1.7–2.4)

## 2023-10-29 MED ORDER — CEFUROXIME AXETIL 500 MG PO TABS: 500.0000 mg | ORAL_TABLET | Freq: Two times a day (BID) | ORAL | 0 refills | Status: AC

## 2023-10-29 MED ORDER — LIVING WELL WITH DIABETES BOOK
Freq: Once | Status: AC
  Filled 2023-10-29: qty 1

## 2023-10-29 MED ORDER — GLIPIZIDE 5 MG PO TABS: 5.0000 mg | ORAL_TABLET | Freq: Two times a day (BID) | ORAL | 0 refills | Status: AC

## 2023-10-29 MED ORDER — GUAIFENESIN-DM 100-10 MG/5ML PO SYRP: 10.0000 mL | ORAL_SOLUTION | ORAL | 0 refills | Status: AC | PRN

## 2023-10-29 MED ORDER — AMLODIPINE BESYLATE 10 MG PO TABS: 10.0000 mg | ORAL_TABLET | Freq: Every day | ORAL | 0 refills | Status: AC

## 2023-10-29 MED ORDER — AMLODIPINE BESYLATE 10 MG PO TABS
10.0000 mg | ORAL_TABLET | Freq: Every day | ORAL | Status: DC
  Administered 2023-10-29: 10 mg via ORAL
  Filled 2023-10-29: qty 1

## 2023-10-29 NOTE — Progress Notes (Signed)
Patient discharged to home, AVS reviewed and IV's removed. Patient has arranged transportation.

## 2023-10-29 NOTE — Progress Notes (Signed)
Mobility Specialist Progress Note:   10/29/23 1034  Mobility  Activity Ambulated with assistance in hallway  Level of Assistance Standby assist, set-up cues, supervision of patient - no hands on  Assistive Device None  Distance Ambulated (ft) 300 ft  Activity Response Tolerated well  Mobility Referral Yes  Mobility visit 1 Mobility  Mobility Specialist Start Time (ACUTE ONLY) [id number]  Mobility Specialist Stop Time (ACUTE ONLY) 0955  Mobility Specialist Time Calculation (min) (ACUTE ONLY) 7 min   Pt received in bed, agreeable to mobility. Standing rest break required d/t slight leg weakness, otherwise asx throughout. VSS. Pt let in bed with call bell in reach and all needs met.   Trevor Medina  Mobility Specialist Please contact via [employer] office at [phone]

## 2023-10-29 NOTE — Inpatient Diabetes Management (Signed)
Inpatient Diabetes Program Recommendations  AACE/ADA: New Consensus Statement on Inpatient Glycemic Control (2015)  Target Ranges:  Prepandial:   less than 140 mg/dL      Peak postprandial:   less than 180 mg/dL (1-2 hours)      Critically ill patients:  140 - 180 mg/dL   Lab Results  Component Value Date   GLUCAP 191 (H) 10/29/2023   HGBA1C 11.2 (H) 10/27/2023    Review of Glycemic Control  Latest Reference Range & Units 10/28/23 21:13 10/29/23 08:21 10/29/23 12:47  Glucose-Capillary 70 - 99 mg/dL 161 (H) 096 (H) 045 (H)   Diabetes history: DM  Outpatient Diabetes medications:  Metformin XR 500 mg bid   Inpatient Diabetes Program Recommendations:    Note plans for patient to d/c home.  Discussed A1C of 11.2% with patient and potential need for insulin.  He states that he knows that the energy drinks and pasta's are what are making blood sugars high.  We briefly discussed goal blood sugars and lifestyle modifications such as the plate method, eliminating sugar from beverages and reducing serving sizes of CHO containing foods such as fruits, pastas, etc.  Encouraged close f/u with his PCP and that he may want to ask about a weekly GLP-1 to help with blood sugars and weight?  Patient appreciative of information.  Will also place referral for outpatient f/u with [facility] pharmacist.  Ordered LWWD booklet for patient as well.   Thanks  Trevor Cambridge, RN, BC-ADM Inpatient Diabetes Coordinator Pager [phone]  (8a-5p)

## 2023-10-29 NOTE — Discharge Summary (Signed)
Physician Discharge Summary  Aditya Zeiter NFA:[id number] DOB: 1971/09/20 DOA: 10/25/2023  PCP: Norm Salt, PA  Admit date: 10/25/2023 Discharge date: 10/29/2023  Admitted From: Home Disposition: Home  Recommendations for Outpatient Follow-up:  Follow up with PCP in 1 week with repeat CBC/BMP Follow up in ED if symptoms worsen or new appear   Home Health: No Equipment/Devices: None  Discharge Condition: Stable CODE STATUS: Full Diet recommendation: Heart healthy/carb modified  Brief/Interim Summary: 53 y.o. male with medical history significant for obesity, hypertension, hyperlipidemia, type 2 diabetes, recently seen in the ED and diagnosed with influenza A on 10/24/23, was out of window for Tamiflu without hypoxia or work of breathing, was discharged home with instruction to keep oral fluid intake and follow-up with PCP for recheck presented with worsening flulike symptoms along with fevers and diarrhea, poor oral intake.  On presentation, he was febrile, tachycardic and tachypneic with leukocytosis and chest x-ray showing multifocal infiltrates suggestive of multifocal pneumonia.  He also had AKI.  He was started on IV antibiotics, Rocephin, Zithromax.  During the hospitalization, his condition has improved.  He is hemodynamically stable with stable respiratory status.  He feels much better to go home today.  He will be discharged home today with outpatient follow-up with PCP.  Discharge Diagnoses:   Sepsis: Present on admission Multifocal pneumonia in the setting of influenza A viral infection and suspected superimposed bacterial pulmonary infection -Hemodynamically improving.  Treated with IV fluids and subsequently discontinued.  Out of the window for Tamiflu. -Remains on room air.  Currently on Rocephin and Zithromax.  No temperature spikes over the few days. -Patient feels much better and wants to go home today.  Discharge home today on oral Ceftin for 3 more days.   Outpatient follow-up with PCP.   AKI -Prerenal due to diarrhea, poor oral intake.  Creatinine 2.76 on presentation.  Treated with IV fluids; creatinine 1.65 today.  Off IV fluids.  Encourage oral intake.  Outpatient follow-up of BMP next week by PCP   leukocytosis -Improved   Diarrhea -Most likely viral diarrhea.  Stool for C. difficile negative.  Stool for GI PCR negative.  Diarrhea is improving.   Hypertension -Continue amlodipine at a higher dose at 10 mg daily.  Olmesartan on hold till reevaluation by PCP.  Blood pressure intermittently on the high side.  Outpatient follow-up with PCP   Hyperlipidemia Continue Crestor   Diabetes mellitus type 2 with hyperglycemia -A1c 11.2.  Carb modified diet.  Metformin to remain on hold.  Start glipizide on discharge.  Outpatient follow-up with PCP regarding need for insulin   Microcytic anemia -Questionable cause.  Hemoglobin stable.  Outpatient follow-up   Obesity class I -Outpatient follow-up   Isolated elevation of bilirubin -Resolved     Discharge Instructions   Allergies as of 10/29/2023   No Known Allergies      Medication List     STOP taking these medications    amLODipine-olmesartan 5-20 MG tablet Commonly known as: AZOR   metFORMIN 500 MG 24 hr tablet Commonly known as: GLUCOPHAGE-XR       TAKE these medications    Alka-Seltzer Plus Cold & Flu 10-12.5-20-650 MG Pack Generic drug: Phenyleph-Doxylamine-DM-APAP Take 1 Dose by mouth every 6 (six) hours as needed (cold and flu symptoms).   amLODipine 10 MG tablet Commonly known as: NORVASC Take 1 tablet (10 mg total) by mouth daily.   cefUROXime 500 MG tablet Commonly known as: CEFTIN Take 1 tablet (500 mg total) by  mouth 2 (two) times daily for 3 days.   glipiZIDE 5 MG tablet Commonly known as: GLUCOTROL Take 1 tablet (5 mg total) by mouth 2 (two) times daily before a meal.   guaiFENesin-dextromethorphan 100-10 MG/5ML syrup Commonly known as:  ROBITUSSIN DM Take 10 mLs by mouth every 4 (four) hours as needed for cough.   rosuvastatin 20 MG tablet Commonly known as: CRESTOR Take 20 mg by mouth daily.        Follow-up Information     Norm Salt, [place]. Schedule an appointment as soon as possible for a visit in 1 week(s).   Specialty: Physician Assistant Why: With repeat CBC/BMP Contact information: [address] [place] [address] [phone]                No Known Allergies  Consultations: None   Procedures/Studies: DG Chest 2 View Result Date: 10/25/2023 CLINICAL DATA:  Chest pain, flu like symptoms for 2-3 days EXAM: CHEST - 2 VIEW COMPARISON:  12/22/2014 FINDINGS: Frontal and lateral views of the chest demonstrate an unremarkable cardiac silhouette. There is patchy airspace disease in the perihilar regions bilaterally, with dense left lower lobe consolidation seen in the retrocardiac region. No effusion or pneumothorax. No acute bony abnormalities. IMPRESSION: 1. Multifocal pneumonia, most pronounced within the left lower lobe. Electronically Signed   By: Sharlet Salina M.D.   On: 10/25/2023 16:33      Subjective: Patient seen and examined at bedside.  Feels better and wants to go home today.  Denies fever, vomiting, chest pain.  Discharge Exam: Vitals:   10/29/23 0533 10/29/23 0546  BP: (!) 189/104 (!) 166/87  Pulse: 88 82  Resp: 20   Temp: 98.8 F (37.1 C)   SpO2: 96%     General: Pt is alert, awake, not in acute distress.  On room air Cardiovascular: rate controlled, S1/S2 + Respiratory: bilateral decreased breath sounds at bases with scattered crackles Abdominal: Soft, obese, NT, ND, bowel sounds + Extremities: Trace lower extremity edema; no cyanosis    The results of significant diagnostics from this hospitalization (including imaging, microbiology, ancillary and laboratory) are listed below for reference.     Microbiology: Recent Results (from the past 240 hours)   Resp panel by RT-PCR (RSV, Flu A&B, Covid) Anterior Nasal Swab     Status: Abnormal   Collection Time: 10/24/23  8:40 PM   Specimen: Anterior Nasal Swab  Result Value Ref Range Status   SARS Coronavirus 2 by RT PCR NEGATIVE NEGATIVE Final   Influenza A by PCR POSITIVE (A) NEGATIVE Final   Influenza B by PCR NEGATIVE NEGATIVE Final    Comment: (NOTE) The Xpert Xpress SARS-CoV-2/FLU/RSV plus assay is intended as an aid in the diagnosis of influenza from Nasopharyngeal swab specimens and should not be used as a sole basis for treatment. Nasal washings and aspirates are unacceptable for Xpert Xpress SARS-CoV-2/FLU/RSV testing.  Fact Sheet for Patients: BloggerCourse.com  Fact Sheet for Healthcare Providers: SeriousBroker.it  This test is not yet approved or cleared by the [place] FDA and has been authorized for detection and/or diagnosis of SARS-CoV-2 by FDA under an Emergency Use Authorization (EUA). This EUA will remain in effect (meaning this test can be used) for the duration of the COVID-19 declaration under Section 564(b)(1) of the Act, 21 U.S.C. section 360bbb-3(b)(1), unless the authorization is terminated or revoked.     Resp Syncytial Virus by PCR NEGATIVE NEGATIVE Final    Comment: (NOTE) Fact Sheet for Patients:  BloggerCourse.com  Fact Sheet for Healthcare Providers: SeriousBroker.it  This test is not yet approved or cleared by the [place] FDA and has been authorized for detection and/or diagnosis of SARS-CoV-2 by FDA under an Emergency Use Authorization (EUA). This EUA will remain in effect (meaning this test can be used) for the duration of the COVID-19 declaration under Section 564(b)(1) of the Act, 21 U.S.C. section 360bbb-3(b)(1), unless the authorization is terminated or revoked.  Performed at [facility], [address], [place],  [place] [address]   Culture, blood (routine x 2)     Status: None (Preliminary result)   Collection Time: 10/25/23  9:08 PM   Specimen: BLOOD  Result Value Ref Range Status   Specimen Description BLOOD RIGHT ANTECUBITAL  Final   Special Requests   Final    BOTTLES DRAWN AEROBIC AND ANAEROBIC Blood Culture adequate volume   Culture   Final    NO GROWTH 3 DAYS Performed at [facility], [address], [place], [place] [address]    Report Status PENDING  Incomplete  Culture, blood (routine x 2)     Status: None (Preliminary result)   Collection Time: 10/25/23  9:13 PM   Specimen: BLOOD  Result Value Ref Range Status   Specimen Description BLOOD BLOOD RIGHT FOREARM  Final   Special Requests   Final    BOTTLES DRAWN AEROBIC AND ANAEROBIC Blood Culture adequate volume   Culture   Final    NO GROWTH 3 DAYS Performed at [facility], [address], [place], [place] [address]    Report Status PENDING  Incomplete  MRSA Next Gen by PCR, Nasal     Status: None   Collection Time: 10/26/23  5:25 AM   Specimen: Nasal Mucosa; Nasal Swab  Result Value Ref Range Status   MRSA by PCR Next Gen NOT DETECTED NOT DETECTED Final    Comment: (NOTE) The GeneXpert MRSA Assay (FDA approved for NASAL specimens only), is one component of a comprehensive MRSA colonization surveillance program. It is not intended to diagnose MRSA infection nor to guide or monitor treatment for MRSA infections. Test performance is not FDA approved in patients less than 11 years old. Performed at [facility], [address] [place], [place] [address]   Gastrointestinal Panel by PCR , Stool     Status: None   Collection Time: 10/26/23 10:43 AM   Specimen: STOOL  Result Value Ref Range Status   Campylobacter species NOT DETECTED NOT DETECTED Final   Plesimonas shigelloides NOT DETECTED NOT DETECTED Final   Salmonella species NOT DETECTED NOT DETECTED Final   Yersinia enterocolitica NOT DETECTED NOT DETECTED  Final   Vibrio species NOT DETECTED NOT DETECTED Final   Vibrio cholerae NOT DETECTED NOT DETECTED Final   Enteroaggregative E coli (EAEC) NOT DETECTED NOT DETECTED Final   Enteropathogenic E coli (EPEC) NOT DETECTED NOT DETECTED Final   Enterotoxigenic E coli (ETEC) NOT DETECTED NOT DETECTED Final   Shiga like toxin producing E coli (STEC) NOT DETECTED NOT DETECTED Final   Shigella/Enteroinvasive E coli (EIEC) NOT DETECTED NOT DETECTED Final   Cryptosporidium NOT DETECTED NOT DETECTED Final   Cyclospora cayetanensis NOT DETECTED NOT DETECTED Final   Entamoeba histolytica NOT DETECTED NOT DETECTED Final   Giardia lamblia NOT DETECTED NOT DETECTED Final   Adenovirus F40/41 NOT DETECTED NOT DETECTED Final   Astrovirus NOT DETECTED NOT DETECTED Final   Norovirus GI/GII NOT DETECTED NOT DETECTED Final  Rotavirus A NOT DETECTED NOT DETECTED Final   Sapovirus (I, II, IV, and V) NOT DETECTED NOT DETECTED Final    Comment: Performed at [facility], [address] [place], [place] [address]  C Difficile Quick Screen w PCR reflex     Status: None   Collection Time: 10/26/23 10:43 AM   Specimen: STOOL  Result Value Ref Range Status   C Diff antigen NEGATIVE NEGATIVE Final   C Diff toxin NEGATIVE NEGATIVE Final   C Diff interpretation No C. difficile detected.  Final    Comment: Performed at [facility], [address], [place], [place] [address]  Culture, blood (single)     Status: None (Preliminary result)   Collection Time: 10/26/23  1:22 PM   Specimen: BLOOD  Result Value Ref Range Status   Specimen Description BLOOD SITE NOT SPECIFIED  Final   Special Requests   Final    BOTTLES DRAWN AEROBIC AND ANAEROBIC Blood Culture adequate volume   Culture   Final    NO GROWTH 2 DAYS Performed at [facility], [address] [place], [place] [address]    Report Status PENDING  Incomplete     Labs: BNP (last 3 results) No results for input(s): "BNP" in the last 8760  hours. Basic Metabolic Panel: Recent Labs  Lab 10/25/23 1542 10/26/23 0525 10/27/23 0722 10/28/23 1123 10/29/23 0553  NA 135 137 139 137 135  K 3.8 3.9 3.7 3.9 3.5  CL 97* 101 105 105 103  CO2 27 24 24 24 22   GLUCOSE 148* 149* 142* 217* 159*  BUN 34* 39* 35* 25* 17  CREATININE 2.76* 2.68* 2.43* 1.81* 1.65*  CALCIUM 7.6* 7.3* 7.3* 7.7* 7.5*  MG  --  2.0 2.5* 2.3 2.1  PHOS  --  3.4  --   --  3.4   Liver Function Tests: Recent Labs  Lab 10/25/23 1542 10/26/23 0525 10/27/23 0722 10/29/23 0553  AST 25 22 22   --   ALT 23 20 21   --   ALKPHOS 94 66 67  --   BILITOT 1.7* 1.0 0.9  --   PROT 6.5 5.8* 5.5*  --   ALBUMIN 2.0* 1.7* 1.5* 1.6*   No results for input(s): "LIPASE", "AMYLASE" in the last 168 hours. No results for input(s): "AMMONIA" in the last 168 hours. CBC: Recent Labs  Lab 10/25/23 1542 10/26/23 0527 10/27/23 0722 10/28/23 1123  WBC 16.0* 14.5* 11.2* 10.6*  NEUTROABS 15.0*  --  9.2* 7.8*  HGB 12.6* 11.5* 10.8* 10.9*  HCT 36.7* 33.1* 31.7* 32.5*  MCV 76.6* 76.3* 76.8* 77.2*  PLT 214 199 299 379   Cardiac Enzymes: No results for input(s): "CKTOTAL", "CKMB", "CKMBINDEX", "TROPONINI" in the last 168 hours. BNP: Invalid input(s): "POCBNP" CBG: Recent Labs  Lab 10/27/23 2119 10/28/23 0830 10/28/23 1226 10/28/23 1620 10/28/23 2113  GLUCAP 186* 196* 194* 212* 157*   D-Dimer No results for input(s): "DDIMER" in the last 72 hours. Hgb A1c Recent Labs    10/27/23 0722  HGBA1C 11.2*   Lipid Profile No results for input(s): "CHOL", "HDL", "LDLCALC", "TRIG", "CHOLHDL", "LDLDIRECT" in the last 72 hours. Thyroid function studies No results for input(s): "TSH", "T4TOTAL", "T3FREE", "THYROIDAB" in the last 72 hours.  Invalid input(s): "FREET3" Anemia work up No results for input(s): "VITAMINB12", "FOLATE", "FERRITIN", "TIBC", "IRON", "RETICCTPCT" in the last 72 hours. Urinalysis    Component Value Date/Time   COLORURINE YELLOW 04/05/2023 0236    APPEARANCEUR CLEAR 04/05/2023  0236   LABSPEC >=1.030 04/05/2023 0236   PHURINE 5.5 04/05/2023 0236   GLUCOSEU 250 (A) 04/05/2023 0236   HGBUR TRACE (A) 04/05/2023 0236   BILIRUBINUR NEGATIVE 04/05/2023 0236   KETONESUR NEGATIVE 04/05/2023 0236   PROTEINUR >=300 (A) 04/05/2023 0236   UROBILINOGEN 0.2 07/07/2015 2030   NITRITE NEGATIVE 04/05/2023 0236   LEUKOCYTESUR NEGATIVE 04/05/2023 0236   Sepsis Labs Recent Labs  Lab 10/25/23 1542 10/26/23 0527 10/27/23 0722 10/28/23 1123  WBC 16.0* 14.5* 11.2* 10.6*   Microbiology Recent Results (from the past 240 hours)  Resp panel by RT-PCR (RSV, Flu A&B, Covid) Anterior Nasal Swab     Status: Abnormal   Collection Time: 10/24/23  8:40 PM   Specimen: Anterior Nasal Swab  Result Value Ref Range Status   SARS Coronavirus 2 by RT PCR NEGATIVE NEGATIVE Final   Influenza A by PCR POSITIVE (A) NEGATIVE Final   Influenza B by PCR NEGATIVE NEGATIVE Final    Comment: (NOTE) The Xpert Xpress SARS-CoV-2/FLU/RSV plus assay is intended as an aid in the diagnosis of influenza from Nasopharyngeal swab specimens and should not be used as a sole basis for treatment. Nasal washings and aspirates are unacceptable for Xpert Xpress SARS-CoV-2/FLU/RSV testing.  Fact Sheet for Patients: BloggerCourse.com  Fact Sheet for Healthcare Providers: SeriousBroker.it  This test is not yet approved or cleared by the [place] FDA and has been authorized for detection and/or diagnosis of SARS-CoV-2 by FDA under an Emergency Use Authorization (EUA). This EUA will remain in effect (meaning this test can be used) for the duration of the COVID-19 declaration under Section 564(b)(1) of the Act, 21 U.S.C. section 360bbb-3(b)(1), unless the authorization is terminated or revoked.     Resp Syncytial Virus by PCR NEGATIVE NEGATIVE Final    Comment: (NOTE) Fact Sheet for  Patients: BloggerCourse.com  Fact Sheet for Healthcare Providers: SeriousBroker.it  This test is not yet approved or cleared by the [place] FDA and has been authorized for detection and/or diagnosis of SARS-CoV-2 by FDA under an Emergency Use Authorization (EUA). This EUA will remain in effect (meaning this test can be used) for the duration of the COVID-19 declaration under Section 564(b)(1) of the Act, 21 U.S.C. section 360bbb-3(b)(1), unless the authorization is terminated or revoked.  Performed at [facility], [address], [place], [place] [address]   Culture, blood (routine x 2)     Status: None (Preliminary result)   Collection Time: 10/25/23  9:08 PM   Specimen: BLOOD  Result Value Ref Range Status   Specimen Description BLOOD RIGHT ANTECUBITAL  Final   Special Requests   Final    BOTTLES DRAWN AEROBIC AND ANAEROBIC Blood Culture adequate volume   Culture   Final    NO GROWTH 3 DAYS Performed at [facility], [address], [place], [place] [address]    Report Status PENDING  Incomplete  Culture, blood (routine x 2)     Status: None (Preliminary result)   Collection Time: 10/25/23  9:13 PM   Specimen: BLOOD  Result Value Ref Range Status   Specimen Description BLOOD BLOOD RIGHT FOREARM  Final   Special Requests   Final    BOTTLES DRAWN AEROBIC AND ANAEROBIC Blood Culture adequate volume   Culture   Final    NO GROWTH 3 DAYS Performed at [facility], [address] [place], [place] [address]    Report Status PENDING  Incomplete  MRSA Next Gen by PCR,  Nasal     Status: None   Collection Time: 10/26/23  5:25 AM   Specimen: Nasal Mucosa; Nasal Swab  Result Value Ref Range Status   MRSA by PCR Next Gen NOT DETECTED NOT DETECTED Final    Comment: (NOTE) The GeneXpert MRSA Assay (FDA approved for NASAL specimens only), is one component of a comprehensive MRSA colonization  surveillance program. It is not intended to diagnose MRSA infection nor to guide or monitor treatment for MRSA infections. Test performance is not FDA approved in patients less than 39 years old. Performed at [facility], [address] [place], [place] [address]   Gastrointestinal Panel by PCR , Stool     Status: None   Collection Time: 10/26/23 10:43 AM   Specimen: STOOL  Result Value Ref Range Status   Campylobacter species NOT DETECTED NOT DETECTED Final   Plesimonas shigelloides NOT DETECTED NOT DETECTED Final   Salmonella species NOT DETECTED NOT DETECTED Final   Yersinia enterocolitica NOT DETECTED NOT DETECTED Final   Vibrio species NOT DETECTED NOT DETECTED Final   Vibrio cholerae NOT DETECTED NOT DETECTED Final   Enteroaggregative E coli (EAEC) NOT DETECTED NOT DETECTED Final   Enteropathogenic E coli (EPEC) NOT DETECTED NOT DETECTED Final   Enterotoxigenic E coli (ETEC) NOT DETECTED NOT DETECTED Final   Shiga like toxin producing E coli (STEC) NOT DETECTED NOT DETECTED Final   Shigella/Enteroinvasive E coli (EIEC) NOT DETECTED NOT DETECTED Final   Cryptosporidium NOT DETECTED NOT DETECTED Final   Cyclospora cayetanensis NOT DETECTED NOT DETECTED Final   Entamoeba histolytica NOT DETECTED NOT DETECTED Final   Giardia lamblia NOT DETECTED NOT DETECTED Final   Adenovirus F40/41 NOT DETECTED NOT DETECTED Final   Astrovirus NOT DETECTED NOT DETECTED Final   Norovirus GI/GII NOT DETECTED NOT DETECTED Final   Rotavirus A NOT DETECTED NOT DETECTED Final   Sapovirus (I, II, IV, and V) NOT DETECTED NOT DETECTED Final    Comment: Performed at [facility], [address] [place], [place] [address]  C Difficile Quick Screen w PCR reflex     Status: None   Collection Time: 10/26/23 10:43 AM   Specimen: STOOL  Result Value Ref Range Status   C Diff antigen NEGATIVE NEGATIVE Final   C Diff toxin NEGATIVE NEGATIVE Final   C Diff interpretation No C. difficile  detected.  Final    Comment: Performed at [facility], [address] [place], [place] [address]  Culture, blood (single)     Status: None (Preliminary result)   Collection Time: 10/26/23  1:22 PM   Specimen: BLOOD  Result Value Ref Range Status   Specimen Description BLOOD SITE NOT SPECIFIED  Final   Special Requests   Final    BOTTLES DRAWN AEROBIC AND ANAEROBIC Blood Culture adequate volume   Culture   Final    NO GROWTH 2 DAYS Performed at [facility], [address], [place], [place] [address]    Report Status PENDING  Incomplete     Time coordinating discharge: 35 minutes  SIGNED:   Glade Lloyd, MD  [facility] 10/29/2023, 8:15 AM

## 2023-10-30 ENCOUNTER — Telehealth: Payer: Self-pay

## 2023-10-30 LAB — CULTURE, BLOOD (ROUTINE X 2)
Culture: NO GROWTH
Culture: NO GROWTH
Special Requests: ADEQUATE
Special Requests: ADEQUATE

## 2023-10-30 NOTE — Progress Notes (Unsigned)
Care Guide Pharmacy Note  10/30/2023 Name: Trevor Medina MRN: [id number] DOB: November 01, 1970  Referred By: Norm Salt, PA Reason for referral: Care Coordination (Outreach to schedule with Pharm d )   Trevor Medina is a 54 y.o. year old male who is a primary care patient of Norm Salt, [place].  Trevor Medina was referred to the pharmacist for assistance related to: DMII  An unsuccessful telephone outreach was attempted today to contact the patient who was referred to the pharmacy team for assistance with medication management. Additional attempts will be made to contact the patient.  Penne Lash , RMA     [facility]  [facility], [facility] Guide  Direct Dial: [phone]  Website: [email or web address]

## 2023-10-31 LAB — CULTURE, BLOOD (SINGLE)
Culture: NO GROWTH
Special Requests: ADEQUATE

## 2023-11-01 NOTE — Progress Notes (Signed)
Care Guide Pharmacy Note  11/01/2023 Name: Trevor Medina MRN: [id number] DOB: 06-14-1971  Referred By: Norm Salt, PA Reason for referral: Care Coordination (Outreach to schedule with Pharm d )   Trevor Medina is a 53 y.o. year old male who is a primary care patient of Norm Salt, [place].  Trevor Medina was referred to the pharmacist for assistance related to: DMII  Successful contact was made with the patient to discuss pharmacy services including being ready for the pharmacist to call at least 5 minutes before the scheduled appointment time and to have medication bottles and any blood pressure readings ready for review. The patient agreed to meet with the pharmacist via telephone visit on (date/time).11/02/2023  Penne Lash , RMA     [place]  [facility], [facility] Guide  Direct Dial: [phone]  Website: [email or web address]

## 2023-11-02 ENCOUNTER — Other Ambulatory Visit: Payer: Self-pay

## 2023-11-02 NOTE — Progress Notes (Signed)
   11/02/2023  Patient ID: Trevor Medina, male   DOB: [birth year], 54 y.o.   MRN: [id number]  Reached patient via telephone to follow up on recent hospital stay and to assist with management of DM/HTN.  Reviewed patient's medication list and confirmed he is still holding the metformin and olmesartan.  Has not had f/u with PCP or scheduled a f/u as instructed at discharge, states he plans to do so after this appt.   Reports he has no way to check BG or BP at home. Counseled on importance of home monitoring for both DM/BP. Patient plans to purchase BP machine and will request rx for BG testing at PCP f/u.  Patient refuses to stick himself and prefers a sensor to monitor BG. Unable to run test claim for patient to check coverage or price at this time.   Counseled on diet and lifestyle modifications for DM/BP control.  Denies any symptoms of high/low BP or high/low BG since back at home.  Reviewed oral rybelsus as a therapeutic option for patient (does not want to give any injections at all so this rules out ozempic). Patient will f/u with PCP.  Sherrill Raring, PharmD Clinical Pharmacist [phone]

## 2023-12-06 IMAGING — DX DG HAND COMPLETE 3+V*R*
3 series · 3 of 3 positions shown · non-contrast
Comparison: None.

CLINICAL DATA: Right hand swelling and pain after injury

EXAM:
RIGHT HAND - COMPLETE 3+ VIEW

[hand pa]
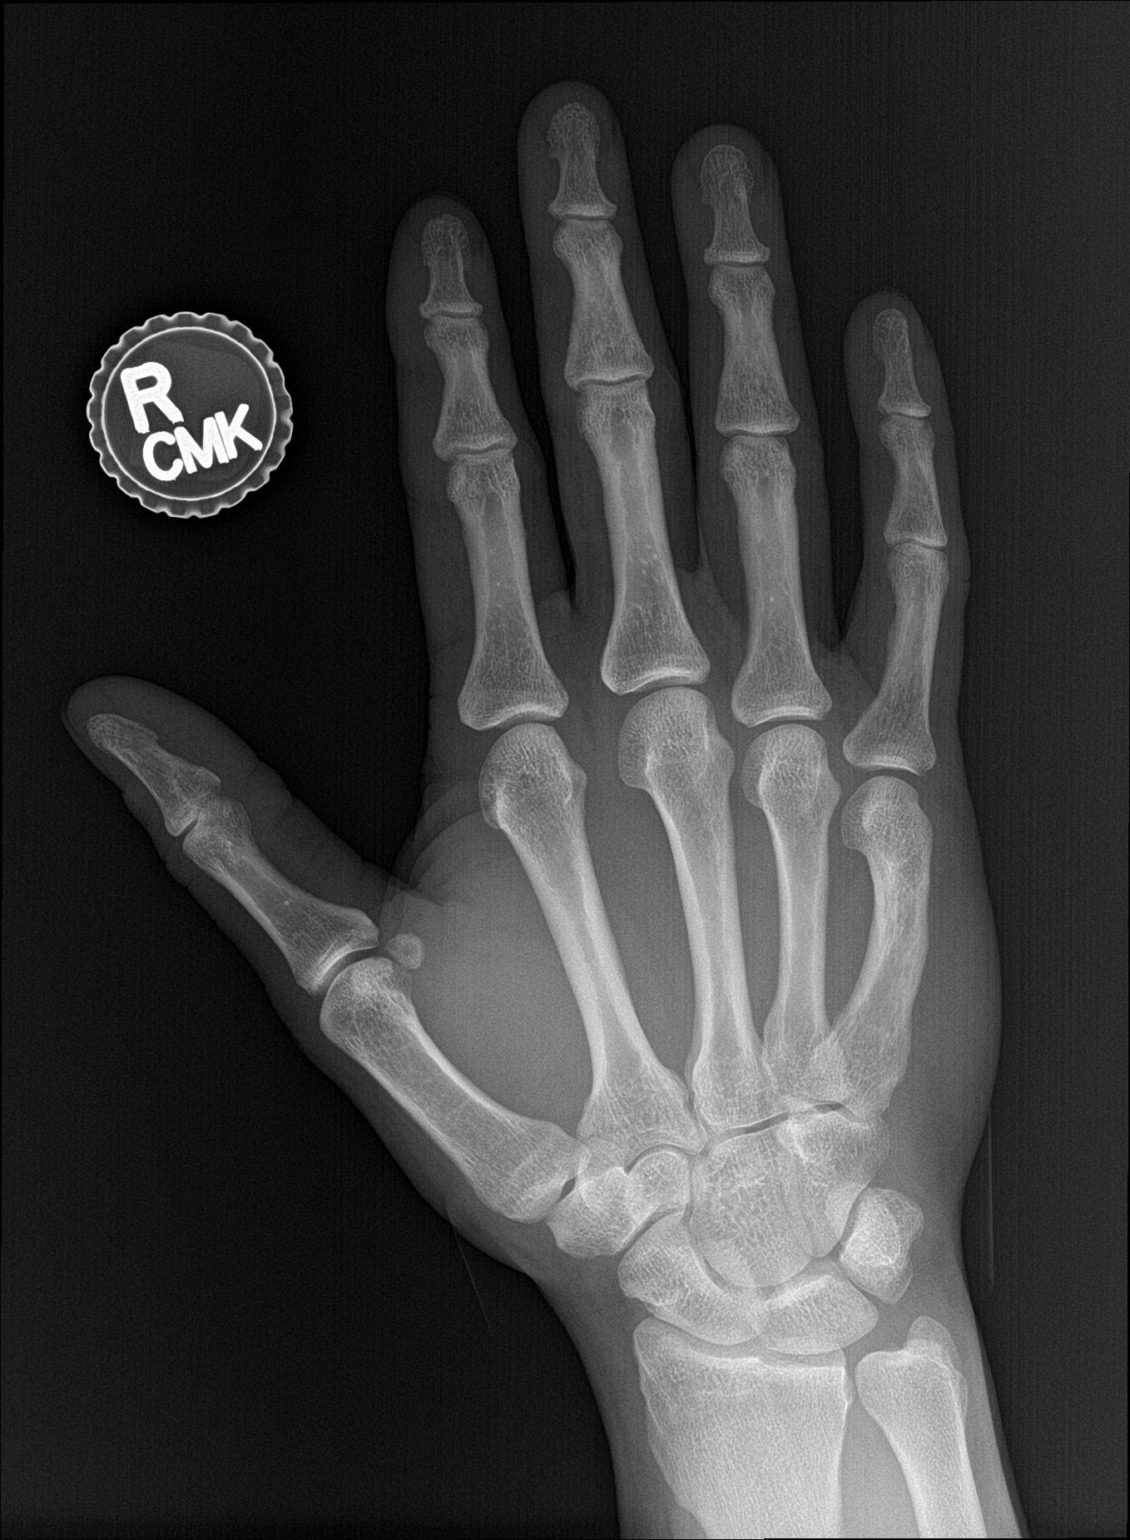

[hand obl]
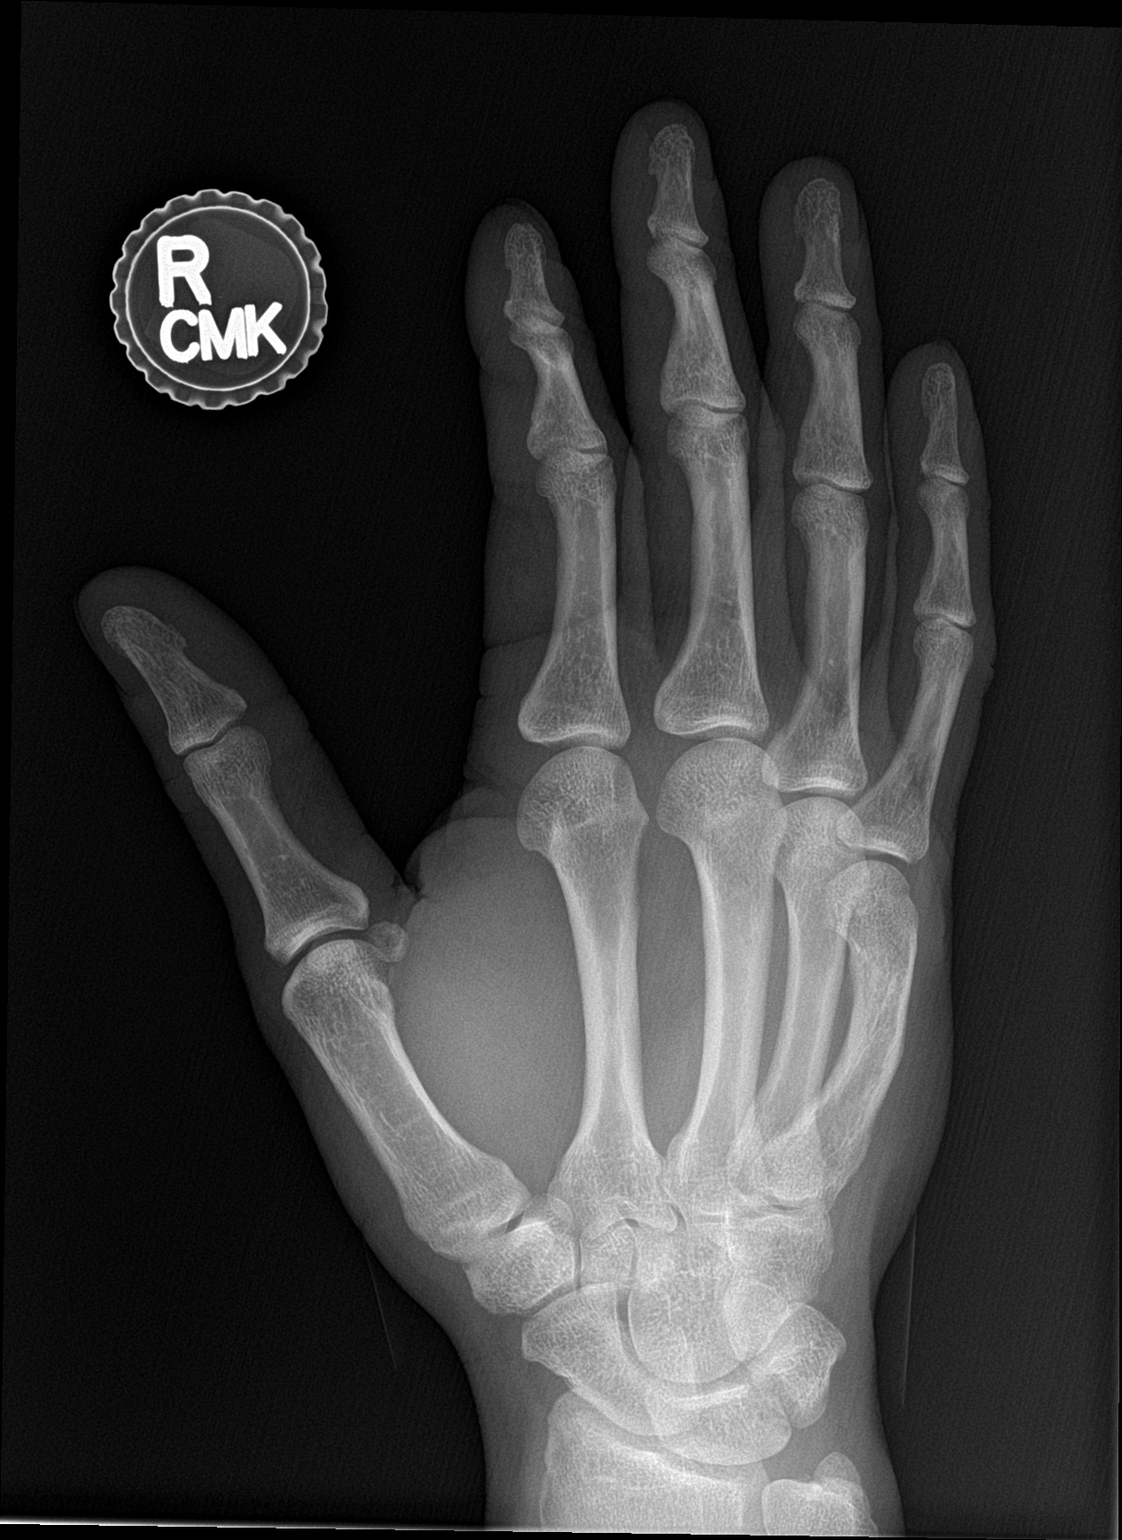

[hand lat]
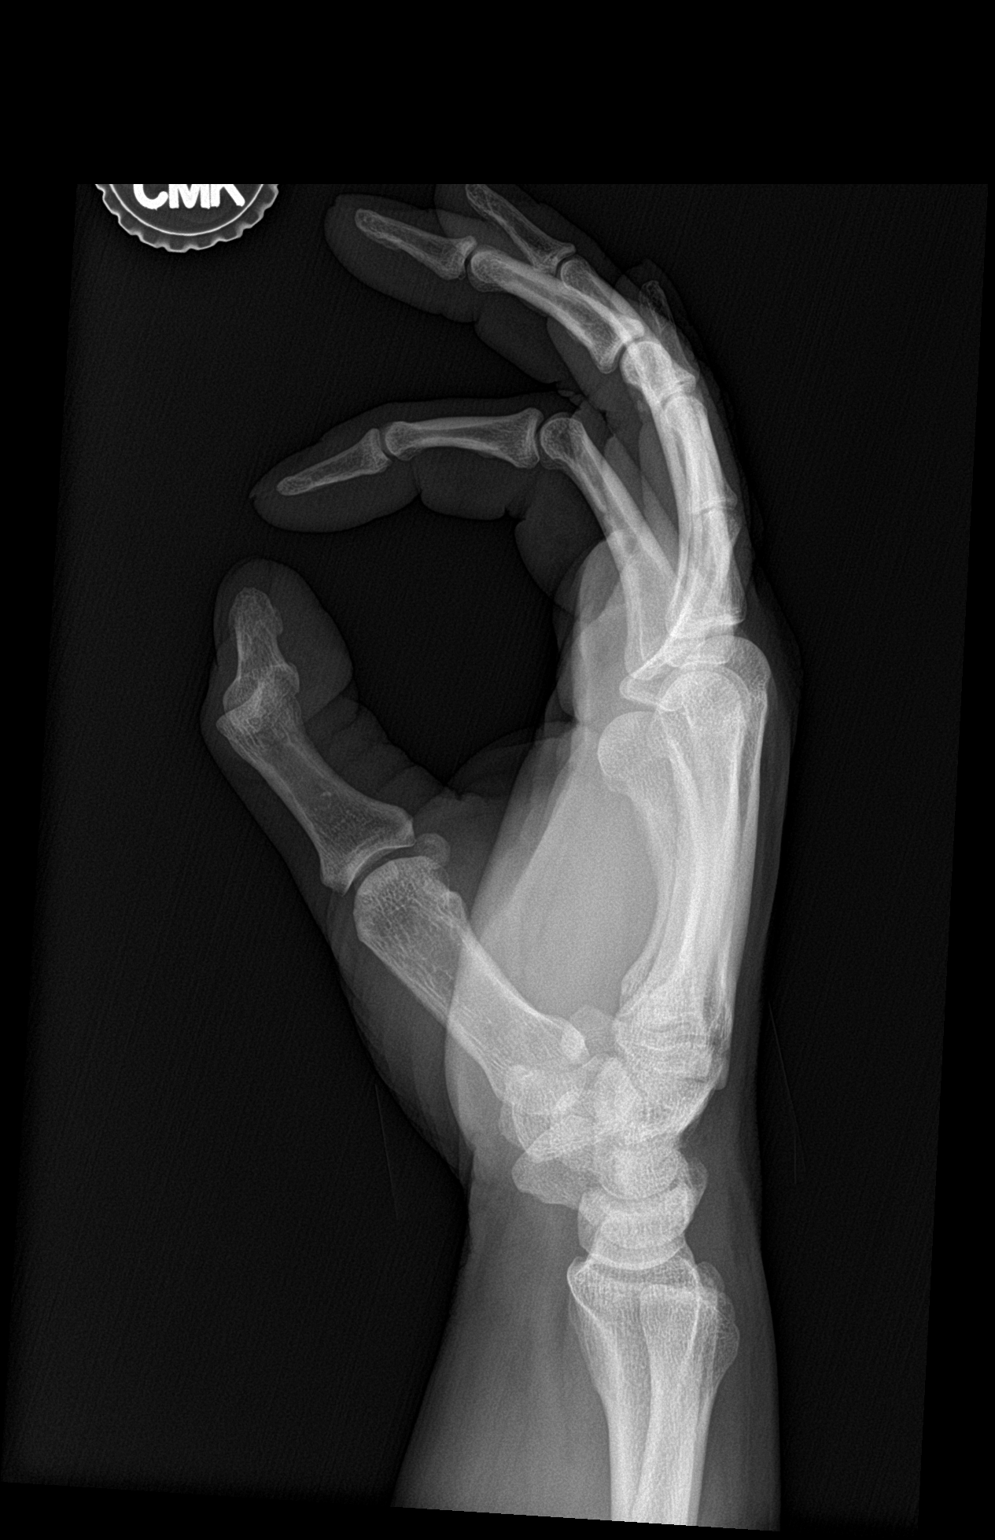

[3 of 3 positions shown; findings below may reference images not displayed]

FINDINGS: There is no evidence of acute fracture or dislocation. Probable old
healed fracture deformity of the fifth metacarpal neck. There is no
evidence of arthropathy or other focal bone abnormality. Mild soft
tissue swelling.
IMPRESSION: Negative.

## 2024-02-13 ENCOUNTER — Other Ambulatory Visit: Payer: Self-pay | Admitting: Physician Assistant

## 2024-02-13 DIAGNOSIS — S8391XD Sprain of unspecified site of right knee, subsequent encounter: Secondary | ICD-10-CM

## 2024-02-20 ENCOUNTER — Ambulatory Visit
Admission: RE | Admit: 2024-02-20 | Discharge: 2024-02-20 | Disposition: A | Discharge status: Home / Self Care | Payer: Worker's Compensation | Source: Ambulatory Visit | Attending: Physician Assistant | Admitting: Physician Assistant

## 2024-02-20 DIAGNOSIS — S8391XD Sprain of unspecified site of right knee, subsequent encounter: Secondary | ICD-10-CM
